# Patient Record
Sex: Female | Born: 1985 | Race: Black or African American | Hispanic: No | Marital: Married | Smoking: Former smoker
Health system: Southern US, Community
[De-identification: ages and names within clinical notes are randomized; demographics above are authoritative.]

## PROBLEM LIST (undated history)

## (undated) ENCOUNTER — Emergency Department (HOSPITAL_COMMUNITY): Admission: EM | Payer: Medicaid Other | Source: Home / Self Care

## (undated) ENCOUNTER — Inpatient Hospital Stay (HOSPITAL_COMMUNITY): Payer: Self-pay

## (undated) DIAGNOSIS — M329 Systemic lupus erythematosus, unspecified: Secondary | ICD-10-CM

## (undated) DIAGNOSIS — Z789 Other specified health status: Secondary | ICD-10-CM

## (undated) DIAGNOSIS — D649 Anemia, unspecified: Secondary | ICD-10-CM

## (undated) DIAGNOSIS — J45909 Unspecified asthma, uncomplicated: Secondary | ICD-10-CM

## (undated) HISTORY — PX: CHOLECYSTECTOMY: SHX55

## (undated) HISTORY — DX: Other specified health status: Z78.9

## (undated) HISTORY — PX: TOOTH EXTRACTION: SUR596

---

## 2016-02-14 NOTE — L&D Delivery Note (Addendum)
Patient is 31 y.o. [id number] [redacted]w[redacted]d admitted for SROM at 0019 this am. Prenatal course also complicated by SLE (not on meds), LGSIL (needs pp colpo), and h/o preterm labor (received 17-p).  Delivery Note At 11:28 AM a viable female was delivered via  (Presentation: LOA).  APGAR: 8,9 ; weight pending .   Placenta status: intact,spontaneous .  Cord: 3 vessel   Anesthesia:  epidural Episiotomy:  none Lacerations:  Bilateral labial, hemostatic Suture Repair: na Est. Blood Loss (mL):  300  Mom to postpartum.  Baby to Couplet care / Skin to Skin.   Upon arrival, patient was complete. She pushed with good maternal effort to deliver a viable female infant in cephalic, LOA position over intact perineum. No nuchal cord present. Anterior shoulder delivered easily, followed by posterior shoulder. Baby was noted to have good tone and place on maternal abdomen for oral suctioning, drying and stimulation. Delayed cord clamping performed. Placenta delivered spontaneously with gentle cord traction. Fundus firm with massage and Pitocin. Perineum inspected and found to have bilateral labial laceration, which was found to be hemostatic, which did not require repair. Counts of sharps, instruments, and lap pads were all correct.   Rolm Bookbinder, DO [place] [facility]

## 2016-04-17 ENCOUNTER — Encounter (HOSPITAL_COMMUNITY): Payer: Self-pay | Admitting: *Deleted

## 2016-04-17 ENCOUNTER — Inpatient Hospital Stay (HOSPITAL_COMMUNITY)
Admission: AD | Admit: 2016-04-17 | Discharge: 2016-04-17 | Disposition: A | Discharge status: Home / Self Care | Payer: Medicaid Other | Source: Ambulatory Visit | Attending: Family Medicine | Admitting: Family Medicine

## 2016-04-17 ENCOUNTER — Telehealth: Payer: Self-pay

## 2016-04-17 DIAGNOSIS — O210 Mild hyperemesis gravidarum: Secondary | ICD-10-CM | POA: Insufficient documentation

## 2016-04-17 DIAGNOSIS — O99611 Diseases of the digestive system complicating pregnancy, first trimester: Secondary | ICD-10-CM | POA: Insufficient documentation

## 2016-04-17 DIAGNOSIS — O219 Vomiting of pregnancy, unspecified: Secondary | ICD-10-CM | POA: Diagnosis not present

## 2016-04-17 DIAGNOSIS — Z3A08 8 weeks gestation of pregnancy: Secondary | ICD-10-CM | POA: Insufficient documentation

## 2016-04-17 DIAGNOSIS — Z87891 Personal history of nicotine dependence: Secondary | ICD-10-CM | POA: Diagnosis not present

## 2016-04-17 DIAGNOSIS — K117 Disturbances of salivary secretion: Secondary | ICD-10-CM

## 2016-04-17 DIAGNOSIS — R748 Abnormal levels of other serum enzymes: Secondary | ICD-10-CM

## 2016-04-17 HISTORY — DX: Anemia, unspecified: D64.9

## 2016-04-17 HISTORY — DX: Unspecified asthma, uncomplicated: J45.909

## 2016-04-17 LAB — POCT PREGNANCY, URINE: Preg Test, Ur: POSITIVE — AB

## 2016-04-17 LAB — COMPREHENSIVE METABOLIC PANEL
ALT: 135 U/L — ABNORMAL HIGH (ref 14–54)
AST: 59 U/L — ABNORMAL HIGH (ref 15–41)
Albumin: 3.9 g/dL (ref 3.5–5.0)
Alkaline Phosphatase: 37 U/L — ABNORMAL LOW (ref 38–126)
Anion gap: 5 (ref 5–15)
BILIRUBIN TOTAL: 0.7 mg/dL (ref 0.3–1.2)
BUN: 9 mg/dL (ref 6–20)
CO2: 23 mmol/L (ref 22–32)
CREATININE: 0.56 mg/dL (ref 0.44–1.00)
Calcium: 9.3 mg/dL (ref 8.9–10.3)
Chloride: 105 mmol/L (ref 101–111)
GFR calc Af Amer: >60 mL/min (ref >60)
Glucose, Bld: 70 mg/dL (ref 65–99)
POTASSIUM: 4 mmol/L (ref 3.5–5.1)
Sodium: 133 mmol/L — ABNORMAL LOW (ref 135–145)
TOTAL PROTEIN: 7.4 g/dL (ref 6.5–8.1)

## 2016-04-17 LAB — LIPASE, BLOOD: LIPASE: 13 U/L (ref 11–51)

## 2016-04-17 LAB — URINALYSIS, ROUTINE W REFLEX MICROSCOPIC
BILIRUBIN URINE: NEGATIVE
GLUCOSE, UA: NEGATIVE mg/dL
HGB URINE DIPSTICK: NEGATIVE
Ketones, ur: 20 mg/dL — AB
LEUKOCYTES UA: NEGATIVE
NITRITE: NEGATIVE
PH: 5 (ref 5.0–8.0)
Protein, ur: 30 mg/dL — AB
Specific Gravity, Urine: 1.031 — ABNORMAL HIGH (ref 1.005–1.030)

## 2016-04-17 LAB — CBC
HEMATOCRIT: 36.3 % (ref 36.0–46.0)
Hemoglobin: 12.8 g/dL (ref 12.0–15.0)
MCH: 33 pg (ref 26.0–34.0)
MCHC: 35.3 g/dL (ref 30.0–36.0)
MCV: 93.6 fL (ref 78.0–100.0)
Platelets: 291 10*3/uL (ref 150–400)
RBC: 3.88 MIL/uL (ref 3.87–5.11)
RDW: 13 % (ref 11.5–15.5)
WBC: 8.4 10*3/uL (ref 4.0–10.5)

## 2016-04-17 MED ORDER — GLYCOPYRROLATE 1 MG PO TABS: 1.0000 mg | ORAL_TABLET | Freq: Three times a day (TID) | ORAL | 0 refills | Status: DC

## 2016-04-17 MED ORDER — PROMETHAZINE HCL 25 MG PO TABS: 25.0000 mg | ORAL_TABLET | Freq: Four times a day (QID) | ORAL | 0 refills | Status: DC | PRN

## 2016-04-17 MED ORDER — GLYCOPYRROLATE 0.2 MG/ML IJ SOLN
0.2000 mg | Freq: Once | INTRAMUSCULAR | Status: AC
  Administered 2016-04-17: 0.2 mg via INTRAVENOUS
  Filled 2016-04-17: qty 1

## 2016-04-17 MED ORDER — PROMETHAZINE HCL 25 MG/ML IJ SOLN
25.0000 mg | Freq: Once | INTRAMUSCULAR | Status: AC
  Administered 2016-04-17: 25 mg via INTRAVENOUS
  Filled 2016-04-17: qty 1

## 2016-04-17 MED ORDER — LACTATED RINGERS IV BOLUS (SEPSIS)
1000.0000 mL | Freq: Once | INTRAVENOUS | Status: AC
  Administered 2016-04-17: 1000 mL via INTRAVENOUS

## 2016-04-17 MED ORDER — FAMOTIDINE IN NACL 20-0.9 MG/50ML-% IV SOLN
20.0000 mg | Freq: Once | INTRAVENOUS | Status: AC
  Administered 2016-04-17: 20 mg via INTRAVENOUS
  Filled 2016-04-17: qty 50

## 2016-04-17 NOTE — MAU Note (Signed)
Past few days, hasn't been able to keep anything down.  Food or water.  Vomiting 4 or 5 times a day. Called the clinic, was told to come here.  preg confirmed at the HD

## 2016-04-17 NOTE — MAU Provider Note (Signed)
History     CSN: [id number]  Arrival date and time: 04/17/16 1551   First Provider Initiated Contact with Patient 04/17/16 1637      Chief Complaint  Patient presents with  . Emesis   HPI Lindsey [name] is a 31 y.o. [id number] at [redacted]w[redacted]d who presents with nausea & vomiting. Reports n/v throughout pregnancy. Vomits 3-5 times per day. Hasn't been able to keep food or fluids down today. Endorses increased saliva/spitting & heartburn. Denies abdominal pain, fever, vaginal bleeding, diarrhea or constipation. Last BM was on Saturday.   OB History    Gravida Para Term Preterm AB Living   4       1 2    SAB TAB Ectopic Multiple Live Births   1              Past Medical History:  Diagnosis Date  . Anemia   . Asthma     Past Surgical History:  Procedure Laterality Date  . CHOLECYSTECTOMY    . TOOTH EXTRACTION      History reviewed. No pertinent family history.  Social History  Substance Use Topics  . Smoking status: Former Smoker    Quit date: 04/17/2012  . Smokeless tobacco: Never Used  . Alcohol use No    Allergies:  Allergies  Allergen Reactions  . Bee Venom Anaphylaxis  . Pineapple Shortness Of Breath and Itching  . Codeine Nausea And Vomiting    Prescriptions Prior to Admission  Medication Sig Dispense Refill Last Dose  . acetaminophen (TYLENOL) 325 MG tablet Take 650 mg by mouth every 6 (six) hours as needed for moderate pain.   04/16/2016 at Unknown time  . Ca Carbonate-Mag Hydroxide (ROLAIDS PO) Take 2 tablets by mouth 3 (three) times daily as needed (heartburn).   Past Week at Unknown time  . Prenatal Vit-Fe Fumarate-FA (PRENATAL MULTIVITAMIN) TABS tablet Take 1 tablet by mouth daily at 12 noon.   04/16/2016 at Unknown time    Review of Systems  Constitutional: Negative for chills and fever.  Gastrointestinal: Positive for nausea and vomiting. Negative for abdominal pain, constipation and diarrhea.  Genitourinary: Negative.    Physical Exam   Blood pressure  112/58, pulse 76, temperature 98.4 F (36.9 C), temperature source Oral, resp. rate 16, height 5\' 3"  (1.6 m), weight 186 lb 3.2 oz (84.5 kg), last menstrual period 02/17/2016, SpO2 100 %.  Physical Exam  Nursing note and vitals reviewed. Constitutional: She is oriented to person, place, and time. She appears well-developed and well-nourished. No distress.  HENT:  Head: Normocephalic and atraumatic.  Mouth/Throat: Mucous membranes are dry.  Eyes: Conjunctivae are normal. Right eye exhibits no discharge. Left eye exhibits no discharge. No scleral icterus.  Neck: Normal range of motion.  Cardiovascular: Normal rate, regular rhythm and normal heart sounds.   No murmur heard. Respiratory: Effort normal and breath sounds normal. No respiratory distress. She has no wheezes.  GI: Soft. Bowel sounds are normal. She exhibits no distension. There is no tenderness. There is negative Murphy's sign.  Neurological: She is alert and oriented to person, place, and time.  Skin: Skin is warm and dry. She is not diaphoretic.  Psychiatric: She has a normal mood and affect. Her behavior is normal. Judgment and thought content normal.    MAU Course  Procedures Results for orders placed or performed during the hospital encounter of 04/17/16 (from the past 24 hour(s))  Urinalysis, Routine w reflex microscopic (not at [facility])     Status: Abnormal  Collection Time: 04/17/16  4:05 PM  Result Value Ref Range   Color, Urine AMBER (A) YELLOW   APPearance HAZY (A) CLEAR   Specific Gravity, Urine 1.031 (H) 1.005 - 1.030   pH 5.0 5.0 - 8.0   Glucose, UA NEGATIVE NEGATIVE mg/dL   Hgb urine dipstick NEGATIVE NEGATIVE   Bilirubin Urine NEGATIVE NEGATIVE   Ketones, ur 20 (A) NEGATIVE mg/dL   Protein, ur 30 (A) NEGATIVE mg/dL   Nitrite NEGATIVE NEGATIVE   Leukocytes, UA NEGATIVE NEGATIVE   RBC / HPF 0-5 0 - 5 RBC/hpf   WBC, UA 0-5 0 - 5 WBC/hpf   Bacteria, UA RARE (A) NONE SEEN   Squamous Epithelial / LPF 0-5 (A)  NONE SEEN   Mucous PRESENT   Pregnancy, urine POC     Status: Abnormal   Collection Time: 04/17/16  4:19 PM  Result Value Ref Range   Preg Test, Ur POSITIVE (A) NEGATIVE  CBC     Status: None   Collection Time: 04/17/16  5:03 PM  Result Value Ref Range   WBC 8.4 4.0 - 10.5 K/uL   RBC 3.88 3.87 - 5.11 MIL/uL   Hemoglobin 12.8 12.0 - 15.0 g/dL   HCT 09.836.3 11.936.0 - 14.746.0 %   MCV 93.6 78.0 - 100.0 fL   MCH 33.0 26.0 - 34.0 pg   MCHC 35.3 30.0 - 36.0 g/dL   RDW 82.913.0 56.211.5 - 13.015.5 %   Platelets 291 150 - 400 K/uL  Comprehensive metabolic panel     Status: Abnormal   Collection Time: 04/17/16  5:03 PM  Result Value Ref Range   Sodium 133 (L) 135 - 145 mmol/L   Potassium 4.0 3.5 - 5.1 mmol/L   Chloride 105 101 - 111 mmol/L   CO2 23 22 - 32 mmol/L   Glucose, Bld 70 65 - 99 mg/dL   BUN 9 6 - 20 mg/dL   Creatinine, Ser 8.650.56 0.44 - 1.00 mg/dL   Calcium 9.3 8.9 - 78.410.3 mg/dL   Total Protein 7.4 6.5 - 8.1 g/dL   Albumin 3.9 3.5 - 5.0 g/dL   AST 59 (H) 15 - 41 U/L   ALT 135 (H) 14 - 54 U/L   Alkaline Phosphatase 37 (L) 38 - 126 U/L   Total Bilirubin 0.7 0.3 - 1.2 mg/dL   GFR calc non Af Amer >60 >60 mL/min   GFR calc Af Amer >60 >60 mL/min   Anion gap 5 5 - 15  Lipase, blood     Status: None   Collection Time: 04/17/16  5:03 PM  Result Value Ref Range   Lipase 13 11 - 51 U/L    MDM Phenergan 25 mg in bag of D5LR, pepcid 20 mg IV, & robinul 0.2mg  CBC, CMP AST & ALT elevated -- lipase ordered -- lipase normal No abdominal pain & benign abdominal exam -- will order hepatitis panel & repeat LFTs at prenatal visit later this month  Assessment and Plan  A: 1. Nausea and vomiting during pregnancy prior to [redacted] weeks gestation   2. Ptyalism   3. Elevated liver enzymes    P: Discharge home Rx phenergan & robinul Advance diet as tolerated Discussed reasons to return to MAU Keep f/u with ob Hepatitis panel pending  Judeth Hornrin Jemar Paulsen 04/17/2016, 4:37 PM

## 2016-04-17 NOTE — Telephone Encounter (Signed)
Returned call and patient stated that she is experiencing severe N&V and cannot even keep down water. [facility] Patient has New OB appt scheduled for 05-08-16.

## 2016-04-17 NOTE — Discharge Instructions (Signed)
Morning Sickness °Morning sickness is when you feel sick to your stomach (nauseous) during pregnancy. This nauseous feeling may or may not come with vomiting. It often occurs in the morning but can be a problem any time of day. Morning sickness is most common during the first trimester, but it may continue throughout pregnancy. While morning sickness is unpleasant, it is usually harmless unless you develop severe and continual vomiting (hyperemesis gravidarum). This condition requires more intense treatment. °What are the causes? °The cause of morning sickness is not completely known but seems to be related to normal hormonal changes that occur in pregnancy. °What increases the risk? °You are at greater risk if you: °· Experienced nausea or vomiting before your pregnancy. °· Had morning sickness during a previous pregnancy. °· Are pregnant with more than one baby, such as twins. ° °How is this treated? °Do not use any medicines (prescription, over-the-counter, or herbal) for morning sickness without first talking to your health care provider. Your health care provider may prescribe or recommend: °· Vitamin B6 supplements. °· Anti-nausea medicines. °· The herbal medicine ginger. ° °Follow these instructions at home: °· Only take over-the-counter or prescription medicines as directed by your health care provider. °· Taking multivitamins before getting pregnant can prevent or decrease the severity of morning sickness in most women. °· Eat a piece of dry toast or unsalted crackers before getting out of bed in the morning. °· Eat five or six small meals a day. °· Eat dry and bland foods (rice, baked potato). Foods high in carbohydrates are often helpful. °· Do not drink liquids with your meals. Drink liquids between meals. °· Avoid greasy, fatty, and spicy foods. °· Get someone to cook for you if the smell of any food causes nausea and vomiting. °· If you feel nauseous after taking prenatal vitamins, take the vitamins at  night or with a snack. °· Snack on protein foods (nuts, yogurt, cheese) between meals if you are hungry. °· Eat unsweetened gelatins for desserts. °· Wearing an acupressure wristband (worn for sea sickness) may be helpful. °· Acupuncture may be helpful. °· Do not smoke. °· Get a humidifier to keep the air in your house free of odors. °· Get plenty of fresh air. °Contact a health care provider if: °· Your home remedies are not working, and you need medicine. °· You feel dizzy or lightheaded. °· You are losing weight. °Get help right away if: °· You have persistent and uncontrolled nausea and vomiting. °· You pass out (faint). °This information is not intended to replace advice given to you by your health care provider. Make sure you discuss any questions you have with your health care provider. °Document Released: 03/23/2006 Document Revised: 07/08/2015 Document Reviewed: 07/17/2012 °Elsevier Interactive Patient Education © 2017 [employer] ° °

## 2016-04-18 LAB — HEPATITIS PANEL, ACUTE
HCV Ab: <0.1 s/co ratio (ref 0.0–0.9)
HEP B S AG: NEGATIVE
Hep A IgM: NEGATIVE
Hep B C IgM: NEGATIVE

## 2016-05-08 ENCOUNTER — Other Ambulatory Visit (HOSPITAL_COMMUNITY)
Admission: RE | Admit: 2016-05-08 | Discharge: 2016-05-08 | Disposition: A | Discharge status: Home / Self Care | Payer: Medicaid Other | Source: Ambulatory Visit | Attending: Certified Nurse Midwife | Admitting: Certified Nurse Midwife

## 2016-05-08 ENCOUNTER — Ambulatory Visit (INDEPENDENT_AMBULATORY_CARE_PROVIDER_SITE_OTHER): Payer: Medicaid Other | Admitting: Certified Nurse Midwife

## 2016-05-08 ENCOUNTER — Encounter: Payer: Self-pay | Admitting: Certified Nurse Midwife

## 2016-05-08 VITALS — BP 108/72 | HR 82 | Temp 98.8°F | Wt 183.8 lb

## 2016-05-08 DIAGNOSIS — D6862 Lupus anticoagulant syndrome: Secondary | ICD-10-CM

## 2016-05-08 DIAGNOSIS — O219 Vomiting of pregnancy, unspecified: Secondary | ICD-10-CM

## 2016-05-08 DIAGNOSIS — O99111 Other diseases of the blood and blood-forming organs and certain disorders involving the immune mechanism complicating pregnancy, first trimester: Secondary | ICD-10-CM

## 2016-05-08 DIAGNOSIS — M329 Systemic lupus erythematosus, unspecified: Secondary | ICD-10-CM

## 2016-05-08 DIAGNOSIS — Z3481 Encounter for supervision of other normal pregnancy, first trimester: Secondary | ICD-10-CM

## 2016-05-08 DIAGNOSIS — O0990 Supervision of high risk pregnancy, unspecified, unspecified trimester: Secondary | ICD-10-CM | POA: Insufficient documentation

## 2016-05-08 DIAGNOSIS — N870 Mild cervical dysplasia: Secondary | ICD-10-CM | POA: Diagnosis not present

## 2016-05-08 DIAGNOSIS — O09219 Supervision of pregnancy with history of pre-term labor, unspecified trimester: Secondary | ICD-10-CM | POA: Insufficient documentation

## 2016-05-08 DIAGNOSIS — Z789 Other specified health status: Secondary | ICD-10-CM | POA: Insufficient documentation

## 2016-05-08 DIAGNOSIS — IMO0002 Reserved for concepts with insufficient information to code with codable children: Secondary | ICD-10-CM | POA: Insufficient documentation

## 2016-05-08 LAB — OB RESULTS CONSOLE GC/CHLAMYDIA: Gonorrhea: NEGATIVE

## 2016-05-08 MED ORDER — ASPIRIN 81 MG PO CHEW: 81.0000 mg | CHEWABLE_TABLET | Freq: Every day | ORAL | 12 refills | Status: DC

## 2016-05-08 MED ORDER — PRENATE PIXIE 10-0.6-0.4-200 MG PO CAPS: 1.0000 | ORAL_CAPSULE | Freq: Every day | ORAL | 12 refills | Status: DC

## 2016-05-08 MED ORDER — DOXYLAMINE-PYRIDOXINE 10-10 MG PO TBEC: DELAYED_RELEASE_TABLET | ORAL | 4 refills | Status: DC

## 2016-05-08 NOTE — Progress Notes (Signed)
Subjective:    Lindsey [name] is being seen today for her first obstetrical visit.  This is a planned pregnancy. She is at [redacted]w[redacted]d gestation. Her obstetrical history is significant for Lupus and preterm delivery @36  weeks. Relationship with FOB: spouse, living together. Patient does intend to breast feed. Pregnancy history fully reviewed.  The information documented in the HPI was reviewed and verified.  Menstrual History: OB History    Gravida Para Term Preterm AB Living   4 2 1 1 1 2    SAB TAB Ectopic Multiple Live Births   0 1     2       Patient's last menstrual period was 02/21/2016.    Past Medical History:  Diagnosis Date  . Anemia   . Asthma   . Medical history non-contributory     Past Surgical History:  Procedure Laterality Date  . CHOLECYSTECTOMY    . TOOTH EXTRACTION       (Not in a hospital admission) Allergies  Allergen Reactions  . Bee Venom Anaphylaxis  . Pineapple Shortness Of Breath and Itching  . Codeine Nausea And Vomiting    Social History  Substance Use Topics  . Smoking status: Former Smoker    Quit date: 04/18/2011  . Smokeless tobacco: Never Used  . Alcohol use No    Family History  Problem Relation Age of Onset  . Diabetes Mother   . Diabetes Paternal Grandmother      Review of Systems Constitutional: negative for weight loss Gastrointestinal: + for nausea & vomiting Genitourinary:negative for genital lesions and vaginal discharge and dysuria Musculoskeletal:negative for back pain Behavioral/Psych: negative for abusive relationship, depression, illegal drug usage and tobacco use    Objective:    BP 108/72   Pulse 82   Temp 98.8 F (37.1 C)   Wt 183 lb 12.8 oz (83.4 kg)   LMP 02/21/2016   BMI 32.56 kg/m  General Appearance:    Alert, cooperative, no distress, appears stated age  Head:    Normocephalic, without obvious abnormality, atraumatic  Eyes:    PERRL, conjunctiva/corneas clear, EOM's intact, fundi    benign, both  eyes  Ears:    Normal TM's and external ear canals, both ears  Nose:   Nares normal, septum midline, mucosa normal, no drainage    or sinus tenderness  Throat:   Lips, mucosa, and tongue normal; teeth and gums normal  Neck:   Supple, symmetrical, trachea midline, no adenopathy;    thyroid:  no enlargement/tenderness/nodules; no carotid   bruit or JVD  Back:     Symmetric, no curvature, ROM normal, no CVA tenderness  Lungs:     Clear to auscultation bilaterally, respirations unlabored  Chest Wall:    No tenderness or deformity   Heart:    Regular rate and rhythm, S1 and S2 normal, no murmur, rub   or gallop  Breast Exam:    No tenderness, masses, or nipple abnormality  Abdomen:     Soft, non-tender, bowel sounds active all four quadrants,    no masses, no organomegaly  Genitalia:    Normal female without lesion, discharge or tenderness  Extremities:   Extremities normal, atraumatic, no cyanosis or edema  Pulses:   2+ and symmetric all extremities  Skin:   Skin color, texture, turgor normal, no rashes or lesions  Lymph nodes:   Cervical, supraclavicular, and axillary nodes normal  Neurologic:   CNII-XII intact, normal strength, sensation and reflexes    throughout  Cervix: long, thick, midline. FHR: 158 by doppler.  FH: size less than U.   Lab Review Urine pregnancy test Labs reviewed no Radiologic studies reviewed no Assessment:    Pregnancy at 3630w0d weeks   Supervision of high risk pregnancy, antepartum - Plan: Cytology - PAP, Cervicovaginal ancillary only, Hemoglobinopathy evaluation, Hemoglobin A1c, Vitamin D (25 hydroxy), Culture, OB Urine, Obstetric Panel, Including HIV, Cystic Fibrosis Mutation 97, Varicella zoster antibody, IgG, TSH, MaterniT Genome, aspirin 81 MG chewable tablet, Prenat-FeAsp-Meth-FA-DHA w/o A (PRENATE PIXIE) 10-0.6-0.4-200 MG CAPS, AMB referral to maternal fetal medicine, AMB MFM GENETICS REFERRAL, US MFM OB DETAIL +14 WK  History of lupus - Plan:  Ambulatory referral to Rheumatology, Protein / creatinine ratio, urine, Hepatic function panel, Lupus anticoagulant panel, Cardiolipin antibodies, IgG, IgM, IgA, Anti-DNA antibody, double-stranded, aspirin 81 MG chewable tablet, AMB referral to maternal fetal medicine, AMB MFM GENETICS REFERRAL, US MFM OB DETAIL +14 WK  Nausea and vomiting during pregnancy prior to [redacted] weeks gestation - Plan: Doxylamine-Pyridoxine (DICLEGIS) 10-10 MG TBEC    Hx of Asthma Plan:      Prenatal vitamins.  Counseling provided regarding continued use of seat belts, cessation of alcohol consumption, smoking or use of illicit drugs; infection precautions i.e., influenza/TDAP immunizations, toxoplasmosis,CMV, parvovirus, listeria and varicella; workplace safety, exercise during pregnancy; routine dental care, safe medications, sexual activity, hot tubs, saunas, pools, travel, caffeine use, fish and methlymercury, potential toxins, hair treatments, varicose veins Weight gain recommendations per IOM guidelines reviewed: underweight/BMI< 18.5--> gain 28 - 40 lbs; normal weight/BMI 18.5 - 24.9--> gain 25 - 35 lbs; overweight/BMI 25 - 29.9--> gain 15 - 25 lbs; obese/BMI >30->gain  11 - 20 lbs Problem list reviewed and updated. FIRST/CF mutation testing/NIPT/QUAD SCREEN/fragile X/Ashkenazi Jewish population testing/Spinal muscular atrophy discussed: ordered. Role of ultrasound in pregnancy discussed; fetal survey: ordered. Amniocentesis discussed: not discussed.  Meds ordered this encounter  Medications  . aspirin 81 MG chewable tablet    Sig: Chew 1 tablet (81 mg total) by mouth daily.    Dispense:  30 tablet    Refill:  12  . Prenat-FeAsp-Meth-FA-DHA w/o A (PRENATE PIXIE) 10-0.6-0.4-200 MG CAPS    Sig: Take 1 tablet by mouth daily.    Dispense:  30 capsule    Refill:  12    Please process coupon: Rx BIN: [id number], RxPCN: [id number], RxGRP: [id number]: [id number], Rx: [id number]: [id number]  SUF: 01  . Doxylamine-Pyridoxine (DICLEGIS) 10-10 MG  TBEC    Sig: Take 1 tablet with breakfast and lunch.  Take 2 tablets at bedtime.    Dispense:  100 tablet    Refill:  4   Orders Placed This Encounter  Procedures  . Culture, OB Urine  . US MFM OB DETAIL +14 WK    Standing Status:   Future    Standing Expiration Date:   07/08/2017    Order Specific Question:   Reason for Exam (SYMPTOM  OR DIAGNOSIS REQUIRED)    Answer:   fetal anatomy scan    Order Specific Question:   Preferred imaging location?    Answer:   [facility]-Ul[facility]ound  . Hemoglobinopathy evaluation  . Hemoglobin A1c  . Vitamin D (25 hydroxy)  . Obstetric Panel, Including HIV  . Cystic Fibrosis Mutation 97  . Varicella zoster antibody, IgG  . TSH  . MaterniT Genome    Order Specific Question:   Is the patient insulin dependent?    Answer:   No    Order Specific Question:   Please enter gestational age. This should  be expressed as weeks AND days, i.e. 16w 6d. Enter weeks here. Enter days in next question.    Answer:   10    Order Specific Question:   Please enter gestational age. This should be expressed as weeks AND days, i.e. 16w 6d. Enter days here. Enter weeks in previous question.    Answer:   0    Order Specific Question:   How was gestational age calculated?    Answer:   LMP    Order Specific Question:   Please give the date of LMP OR Ultrasound OR Estimated date of delivery.    Answer:   11/27/2016    Order Specific Question:   Number of Fetuses (Type of Pregnancy):    Answer:   1    Order Specific Question:   Indications for performing the test? (please choose all that apply):    Answer:   Routine screening    Order Specific Question:   Other Indications? (Y=Yes, N=No)    Answer:   Y    Order Specific Question:   Please specify other indications, if any:    Answer:   Lupus    Order Specific Question:   If this is a repeat specimen, please indicate the reason:    Answer:   Not indicated    Order Specific Question:   Please specify the patient's race:  (C=White/Caucasion, B=Black, I=Native American, A=Asian, H=Hispanic, O=Other, U=Unknown)    Answer:   B    Order Specific Question:   Donor Egg - indicate if the egg was obtained from in vitro fertilization.    Answer:   N    Order Specific Question:   Age of Egg Donor.    Answer:   21    Order Specific Question:   Prior Down Syndrome/ONTD screening during current pregnancy.    Answer:   N    Order Specific Question:   Prior First Trimester Testing    Answer:   N    Order Specific Question:   Prior Second Trimester Testing    Answer:   N    Order Specific Question:   Family History of Neural Tube Defects    Answer:   N    Order Specific Question:   Prior Pregnancy with Down Syndrome    Answer:   N    Order Specific Question:   Please give the patient's weight (in pounds)    Answer:   186  . Protein / creatinine ratio, urine  . Hepatic function panel  . Lupus anticoagulant panel  . Cardiolipin antibodies, IgG, IgM, IgA  . Anti-DNA antibody, double-stranded  . Ambulatory referral to Rheumatology    Referral Priority:   Routine    Referral Type:   Consultation    Referral Reason:   Specialty Services Required    Requested Specialty:   Rheumatology    Number of Visits Requested:   1  . AMB referral to maternal fetal medicine    Referral Priority:   Routine    Referral Type:   Consultation    Referral Reason:   Specialty Services Required    Number of Visits Requested:   1  . AMB MFM GENETICS REFERRAL    Referral Priority:   Routine    Referral Type:   Consultation    Referral Reason:   Specialty Services Required    Number of Visits Requested:   1    Follow up in 4 weeks. 50% of 30 min visit  spent on counseling and coordination of care.

## 2016-05-08 NOTE — Progress Notes (Signed)
Patient is in the office for initial ob visit, complains of N&V.

## 2016-05-09 LAB — CERVICOVAGINAL ANCILLARY ONLY
BACTERIAL VAGINITIS: NEGATIVE
CHLAMYDIA, DNA PROBE: NEGATIVE
Candida vaginitis: NEGATIVE
NEISSERIA GONORRHEA: NEGATIVE
TRICH (WINDOWPATH): NEGATIVE

## 2016-05-09 LAB — PROTEIN / CREATININE RATIO, URINE
Creatinine, Urine: 248.9 mg/dL
Protein, Ur: 32.3 mg/dL
Protein/Creat Ratio: 130 mg/g creat (ref 0–200)

## 2016-05-10 LAB — CYTOLOGY - PAP: ADEQUACY: ABSENT — AB

## 2016-05-11 ENCOUNTER — Other Ambulatory Visit: Payer: Self-pay | Admitting: Certified Nurse Midwife

## 2016-05-11 DIAGNOSIS — R87612 Low grade squamous intraepithelial lesion on cytologic smear of cervix (LGSIL): Secondary | ICD-10-CM

## 2016-05-12 LAB — OBSTETRIC PANEL, INCLUDING HIV
Antibody Screen: NEGATIVE
Basophils Absolute: 0 10*3/uL (ref 0.0–0.2)
Basos: 0 %
EOS (ABSOLUTE): 0.1 10*3/uL (ref 0.0–0.4)
Eos: 2 %
HIV Screen 4th Generation wRfx: NONREACTIVE
Hematocrit: 32.7 % — ABNORMAL LOW (ref 34.0–46.6)
Hemoglobin: 11.2 g/dL (ref 11.1–15.9)
Hepatitis B Surface Ag: NEGATIVE
IMMATURE GRANS (ABS): 0 10*3/uL (ref 0.0–0.1)
Immature Granulocytes: 0 %
Lymphocytes Absolute: 2.2 10*3/uL (ref 0.7–3.1)
Lymphs: 26 %
MCH: 32.3 pg (ref 26.6–33.0)
MCHC: 34.3 g/dL (ref 31.5–35.7)
MCV: 94 fL (ref 79–97)
MONOCYTES: 8 %
Monocytes Absolute: 0.7 10*3/uL (ref 0.1–0.9)
Neutrophils Absolute: 5.4 10*3/uL (ref 1.4–7.0)
Neutrophils: 64 %
PLATELETS: 315 10*3/uL (ref 150–379)
RBC: 3.47 x10E6/uL — ABNORMAL LOW (ref 3.77–5.28)
RDW: 13.9 % (ref 12.3–15.4)
RPR: NONREACTIVE
RUBELLA: 1.24 {index} (ref >0.99)
Rh Factor: POSITIVE
WBC: 8.4 10*3/uL (ref 3.4–10.8)

## 2016-05-12 LAB — CARDIOLIPIN ANTIBODIES, IGG, IGM, IGA
ANTICARDIOLIPIN IGM: 9 [MPL'U]/mL (ref 0–12)
Anticardiolipin IgA: <9 APL U/mL (ref 0–11)

## 2016-05-12 LAB — CYSTIC FIBROSIS MUTATION 97: GENE DIS ANAL CARRIER INTERP BLD/T-IMP: NOT DETECTED

## 2016-05-12 LAB — HEMOGLOBINOPATHY EVALUATION
HGB C: 0 %
HGB S: 0 %
HGB VARIANT: 0 %
Hemoglobin A2 Quantitation: 2.5 % (ref 1.8–3.2)
Hemoglobin F Quantitation: 0 % (ref 0.0–2.0)
Hgb A: 97.5 % (ref 96.4–98.8)

## 2016-05-12 LAB — HEPATIC FUNCTION PANEL
ALK PHOS: 52 IU/L (ref 39–117)
ALT: 28 IU/L (ref 0–32)
AST: 15 IU/L (ref 0–40)
Albumin: 4 g/dL (ref 3.5–5.5)
Bilirubin Total: 0.3 mg/dL (ref 0.0–1.2)
Bilirubin, Direct: 0.09 mg/dL (ref 0.00–0.40)
TOTAL PROTEIN: 7.3 g/dL (ref 6.0–8.5)

## 2016-05-12 LAB — LUPUS ANTICOAGULANT PANEL
DRVVT: 30 s (ref 0.0–47.0)
PTT LA: 32.4 s (ref 0.0–51.9)

## 2016-05-12 LAB — TSH: TSH: 2.58 u[IU]/mL (ref 0.450–4.500)

## 2016-05-12 LAB — VARICELLA ZOSTER ANTIBODY, IGG: VARICELLA: 2307 {index} (ref >165)

## 2016-05-12 LAB — ANTI-DNA ANTIBODY, DOUBLE-STRANDED: dsDNA Ab: 2 IU/mL (ref 0–9)

## 2016-05-12 LAB — VITAMIN D 25 HYDROXY (VIT D DEFICIENCY, FRACTURES): VIT D 25 HYDROXY: 9.5 ng/mL — AB (ref 30.0–100.0)

## 2016-05-12 LAB — HEMOGLOBIN A1C
ESTIMATED AVERAGE GLUCOSE: 111 mg/dL
Hgb A1c MFr Bld: 5.5 % (ref 4.8–5.6)

## 2016-05-14 LAB — URINE CULTURE, OB REFLEX

## 2016-05-14 LAB — CULTURE, OB URINE

## 2016-05-15 ENCOUNTER — Other Ambulatory Visit: Payer: Self-pay | Admitting: Certified Nurse Midwife

## 2016-05-15 DIAGNOSIS — O0990 Supervision of high risk pregnancy, unspecified, unspecified trimester: Secondary | ICD-10-CM

## 2016-05-15 DIAGNOSIS — R7989 Other specified abnormal findings of blood chemistry: Secondary | ICD-10-CM

## 2016-05-15 LAB — MATERNIT GENOME

## 2016-05-15 MED ORDER — VITAMIN D (ERGOCALCIFEROL) 1.25 MG (50000 UNIT) PO CAPS: 50000.0000 [IU] | ORAL_CAPSULE | ORAL | 2 refills | Status: DC

## 2016-06-05 ENCOUNTER — Ambulatory Visit (INDEPENDENT_AMBULATORY_CARE_PROVIDER_SITE_OTHER): Payer: Medicaid Other | Admitting: Obstetrics and Gynecology

## 2016-06-05 VITALS — BP 106/71 | HR 83 | Wt 190.0 lb

## 2016-06-05 DIAGNOSIS — O0992 Supervision of high risk pregnancy, unspecified, second trimester: Secondary | ICD-10-CM | POA: Diagnosis not present

## 2016-06-05 DIAGNOSIS — O99112 Other diseases of the blood and blood-forming organs and certain disorders involving the immune mechanism complicating pregnancy, second trimester: Secondary | ICD-10-CM

## 2016-06-05 DIAGNOSIS — R87612 Low grade squamous intraepithelial lesion on cytologic smear of cervix (LGSIL): Secondary | ICD-10-CM

## 2016-06-05 DIAGNOSIS — O0990 Supervision of high risk pregnancy, unspecified, unspecified trimester: Secondary | ICD-10-CM

## 2016-06-05 DIAGNOSIS — D6862 Lupus anticoagulant syndrome: Secondary | ICD-10-CM

## 2016-06-05 DIAGNOSIS — O99119 Other diseases of the blood and blood-forming organs and certain disorders involving the immune mechanism complicating pregnancy, unspecified trimester: Secondary | ICD-10-CM

## 2016-06-05 DIAGNOSIS — O09212 Supervision of pregnancy with history of pre-term labor, second trimester: Secondary | ICD-10-CM | POA: Diagnosis not present

## 2016-06-05 MED ORDER — HYDROXYCHLOROQUINE SULFATE 200 MG PO TABS: 200.0000 mg | ORAL_TABLET | Freq: Every day | ORAL | 6 refills | Status: DC

## 2016-06-05 NOTE — Progress Notes (Signed)
   PRENATAL VISIT NOTE  Subjective:  Lindsey [name] is a 31 y.o. Z6X0960 at [redacted]w[redacted]d being seen today for ongoing prenatal care.  She is currently monitored for the following issues for this high-risk pregnancy and has Supervision of high risk pregnancy, antepartum; Medical history non-contributory; History of lupus; Current pregnancy with history of preterm labor; Low grade squamous intraepithelial lesion (LGSIL) on cervical Pap smear; Low vitamin D level; and Lupus anticoagulant complicating pregnancy, antepartum (HCC) on her problem list.  Patient reports no complaints.  Contractions: Not present. Vag. Bleeding: None.  Movement: Present. Denies leaking of fluid.   The following portions of the patient's history were reviewed and updated as appropriate: allergies, current medications, past family history, past medical history, past social history, past surgical history and problem list. Problem list updated.  Objective:   Vitals:   06/05/16 0903  BP: 106/71  Pulse: 83  Weight: 190 lb (86.2 kg)    Fetal Status: Fetal Heart Rate (bpm): 153   Movement: Present     General:  Alert, oriented and cooperative. Patient is in no acute distress.  Skin: Skin is warm and dry. No rash noted.   Cardiovascular: Normal heart rate noted  Respiratory: Normal respiratory effort, no problems with respiration noted  Abdomen: Soft, gravid, appropriate for gestational age. Pain/Pressure: Present     Pelvic:  Cervical exam deferred        Extremities: Normal range of motion.  Edema: None  Mental Status: Normal mood and affect. Normal behavior. Normal judgment and thought content.   Assessment and Plan:  Pregnancy: A5W0981 at [redacted]w[redacted]d  1. Supervision of high risk pregnancy, antepartum Patient is doing well without complaints Follow up anatomy ultrasound already scheduled - Ambulatory referral to Rheumatology  2. Current pregnancy with history of pre-term labor in second trimester Patient with previous  36 week delivery Patient desires weekly 17-P. Form completed today with plans to start 17-P next week  3. Lupus anticoagulant complicating pregnancy, antepartum (HCC) - Patient has not been taking the Plaquenil as she relocated and has not established care - Rx provided and referral to rheumatology made - Continue daily ASA  4. Low grade squamous intraepithelial lesion (LGSIL) on cervical Pap smear Plan for colpo postpartum Will inform patient at her next visit if not already informed  General obstetric precautions including but not limited to vaginal bleeding, contractions, leaking of fluid and fetal movement were reviewed in detail with the patient. Please refer to After Visit Summary for other counseling recommendations.  Return in about 4 weeks (around 07/03/2016) for ROB, weekly for 17-P.   Catalina Antigua, MD

## 2016-06-12 ENCOUNTER — Ambulatory Visit: Payer: Medicaid Other

## 2016-06-16 ENCOUNTER — Encounter: Payer: Self-pay | Admitting: *Deleted

## 2016-06-16 NOTE — Progress Notes (Signed)
TC from pt needs a high risk pregnancy letter for court.

## 2016-06-19 ENCOUNTER — Ambulatory Visit: Payer: Medicaid Other

## 2016-06-26 ENCOUNTER — Ambulatory Visit: Payer: Medicaid Other

## 2016-06-30 ENCOUNTER — Ambulatory Visit (HOSPITAL_COMMUNITY)
Admission: RE | Admit: 2016-06-30 | Discharge: 2016-06-30 | Disposition: A | Discharge status: Home / Self Care | Payer: Medicaid Other | Source: Ambulatory Visit | Attending: Certified Nurse Midwife | Admitting: Certified Nurse Midwife

## 2016-06-30 ENCOUNTER — Encounter (HOSPITAL_COMMUNITY): Payer: Self-pay

## 2016-06-30 ENCOUNTER — Other Ambulatory Visit (HOSPITAL_COMMUNITY): Payer: Self-pay | Admitting: *Deleted

## 2016-06-30 ENCOUNTER — Other Ambulatory Visit: Payer: Self-pay | Admitting: Certified Nurse Midwife

## 2016-06-30 DIAGNOSIS — M329 Systemic lupus erythematosus, unspecified: Secondary | ICD-10-CM

## 2016-06-30 DIAGNOSIS — O9989 Other specified diseases and conditions complicating pregnancy, childbirth and the puerperium: Secondary | ICD-10-CM | POA: Diagnosis present

## 2016-06-30 DIAGNOSIS — Z3A18 18 weeks gestation of pregnancy: Secondary | ICD-10-CM

## 2016-06-30 DIAGNOSIS — O0990 Supervision of high risk pregnancy, unspecified, unspecified trimester: Secondary | ICD-10-CM

## 2016-06-30 DIAGNOSIS — O99891 Other specified diseases and conditions complicating pregnancy: Secondary | ICD-10-CM

## 2016-06-30 DIAGNOSIS — Z3689 Encounter for other specified antenatal screening: Secondary | ICD-10-CM

## 2016-06-30 DIAGNOSIS — Z8751 Personal history of pre-term labor: Secondary | ICD-10-CM

## 2016-06-30 DIAGNOSIS — IMO0002 Reserved for concepts with insufficient information to code with codable children: Secondary | ICD-10-CM

## 2016-06-30 NOTE — Progress Notes (Signed)
Maternal Fetal Medicine Consultation  Requesting Provider(s): [facility]  Primary OB: Constant Reason for consultation: SLE  HPI: 31yo P11012 at 18+4 weeks with 5 year history of SLE. She was diagnosed after a long period of fatigue, joint swelling and proteinuria. She has not had a flare for about a year. She is on Plaquenil 200mg  daily x 4 weeks, she had stopped the medication when she found she was pregnant. She has undergone APL labs and there is no APL antibodies or lupus anticoagulant. She has not had complement levels or a 24h urine.   OB History: OB History    Gravida Para Term Preterm AB Living   4 2 1 1 1 2    SAB TAB Ectopic Multiple Live Births   0 1     2    Her first child was born in 2007 prior to her lupus diagnosis and weight >10 lb; she did not have GDM. Her second baby was born at 36 weeks and weighed 5 pounds 2 ounces. It was a spontaneous PTD  PMH:  Past Medical History:  Diagnosis Date  . Anemia   . Asthma   . Medical history non-contributory     PSH:  Past Surgical History:  Procedure Laterality Date  . CHOLECYSTECTOMY    . TOOTH EXTRACTION     Meds: See EPIC section Allergies: codeine FH:See EPIC section Soc:See EPIC section  Review of Systems: no vaginal bleeding or cramping/contractions, no LOF, no nausea/vomiting. All other systems reviewed and are negative.  PE: See EPIC section  Please see separate document for fetal ultrasound report.  A/P: 1. SLE: she does not meet diagnostic criteria for APAS and does not require thromboprophylaxis. I strongly recommend obtaining a 24h urine and complement levels as soon as possible both for baseline purposes. We will see here every 4 weeks to assess growth and I would recommend starting antepartum testing at 30 weeks. She stated you were planning referral to a rheumatologist and I would strongly encourage that to be done as soon as possible. I reassured her the Plaquenil was safe for pregnancy, and that if she  required steroids for flare those were relatively safe as well. 2. History of late PTD: she stated she was told that she should start 17-OHP injections but missed that appointment. I encouraged her to contact your office today so that can be started immediately  Thank you for the opportunity to be a part of the care of Lindsey [name]. Please contact our office if we can be of further assistance.   I spent approximately 30 minutes with this patient with over 50% of time spent in face-to-face counseling.

## 2016-07-03 ENCOUNTER — Encounter: Payer: Medicaid Other | Admitting: Obstetrics and Gynecology

## 2016-07-03 ENCOUNTER — Other Ambulatory Visit: Payer: Self-pay | Admitting: Certified Nurse Midwife

## 2016-07-03 DIAGNOSIS — O0990 Supervision of high risk pregnancy, unspecified, unspecified trimester: Secondary | ICD-10-CM

## 2016-07-05 ENCOUNTER — Ambulatory Visit (INDEPENDENT_AMBULATORY_CARE_PROVIDER_SITE_OTHER): Payer: Medicaid Other

## 2016-07-05 DIAGNOSIS — O09212 Supervision of pregnancy with history of pre-term labor, second trimester: Secondary | ICD-10-CM | POA: Diagnosis not present

## 2016-07-05 DIAGNOSIS — Z8751 Personal history of pre-term labor: Secondary | ICD-10-CM

## 2016-07-05 MED ORDER — HYDROXYPROGESTERONE CAPROATE 250 MG/ML IM OIL
250.0000 mg | TOPICAL_OIL | Freq: Once | INTRAMUSCULAR | Status: AC
  Administered 2016-07-05: 250 mg via INTRAMUSCULAR

## 2016-07-05 NOTE — Progress Notes (Signed)
17p given L upper outer quad w/o difficulty

## 2016-07-12 ENCOUNTER — Encounter: Payer: Self-pay | Admitting: Obstetrics & Gynecology

## 2016-07-12 ENCOUNTER — Other Ambulatory Visit: Payer: Self-pay | Admitting: Student

## 2016-07-12 ENCOUNTER — Ambulatory Visit (INDEPENDENT_AMBULATORY_CARE_PROVIDER_SITE_OTHER): Payer: Medicaid Other | Admitting: Obstetrics & Gynecology

## 2016-07-12 VITALS — BP 112/70 | HR 90 | Wt 187.5 lb

## 2016-07-12 DIAGNOSIS — D6862 Lupus anticoagulant syndrome: Secondary | ICD-10-CM

## 2016-07-12 DIAGNOSIS — O0990 Supervision of high risk pregnancy, unspecified, unspecified trimester: Secondary | ICD-10-CM

## 2016-07-12 DIAGNOSIS — O99119 Other diseases of the blood and blood-forming organs and certain disorders involving the immune mechanism complicating pregnancy, unspecified trimester: Principal | ICD-10-CM

## 2016-07-12 DIAGNOSIS — O99112 Other diseases of the blood and blood-forming organs and certain disorders involving the immune mechanism complicating pregnancy, second trimester: Secondary | ICD-10-CM

## 2016-07-12 DIAGNOSIS — O09212 Supervision of pregnancy with history of pre-term labor, second trimester: Secondary | ICD-10-CM

## 2016-07-12 DIAGNOSIS — O0992 Supervision of high risk pregnancy, unspecified, second trimester: Secondary | ICD-10-CM

## 2016-07-12 MED ORDER — HYDROXYPROGESTERONE CAPROATE 250 MG/ML IM OIL
250.0000 mg | TOPICAL_OIL | Freq: Once | INTRAMUSCULAR | Status: AC
  Administered 2016-07-12: 250 mg via INTRAMUSCULAR

## 2016-07-12 NOTE — Progress Notes (Signed)
   PRENATAL VISIT NOTE  Subjective:  Lindsey [name] [name] a 31 y.o. [id number] at 6936w2d being seen today for ongoing prenatal care.  Lindsey [name] currently monitored for the following issues for this high-risk pregnancy and has Supervision of high risk pregnancy, antepartum; Medical history non-contributory; History of lupus; Current pregnancy with history of preterm labor; Low grade squamous intraepithelial lesion (LGSIL) on cervical Pap smear; Low vitamin D level; Lupus anticoagulant complicating pregnancy, antepartum (HCC); Systemic lupus complicating pregnancy (HCC); and History of preterm delivery on her problem list.  Patient reports no complaints.  Contractions: Not present. Vag. Bleeding: None.  Movement: Present. Denies leaking of fluid.   The following portions of the patient's history were reviewed and updated as appropriate: allergies, current medications, past family history, past medical history, past social history, past surgical history and problem list. Problem list updated.  Objective:   Vitals:   07/12/16 1343  BP: 112/70  Pulse: 90  Weight: 187 lb 8 oz (85 kg)    Fetal Status: Fetal Heart Rate (bpm): 148 Fundal Height: 20 cm Movement: Present     General:  Alert, oriented and cooperative. Patient [name] in no acute distress.  Skin: Skin [name] warm and dry. No rash noted.   Cardiovascular: Normal heart rate noted  Respiratory: Normal respiratory effort, no problems with respiration noted  Abdomen: Soft, gravid, appropriate for gestational age. Pain/Pressure: Present     Pelvic:  Cervical exam deferred        Extremities: Normal range of motion.  Edema: None  Mental Status: Normal mood and affect. Normal behavior. Normal judgment and thought content.   Assessment and Plan:  Pregnancy: [id number] at 936w2d  1. Supervision of high risk pregnancy, antepartum 17 p for h/o PTB  Preterm labor symptoms and general obstetric precautions including but not limited to vaginal bleeding,  contractions, leaking of fluid and fetal movement were reviewed in detail with the patient. Please refer to After Visit Summary for other counseling recommendations.  Return in about 4 weeks (around 08/09/2016).   Scheryl DarterJames Wagner Tanzi, MD

## 2016-07-12 NOTE — Patient Instructions (Signed)
Second Trimester of Pregnancy The second trimester is from week 13 through week 28, month 4 through 6. This is often the time in pregnancy that you feel your best. Often times, morning sickness has lessened or quit. You may have more energy, and you may get hungry more often. Your unborn baby (fetus) is growing rapidly. At the end of the sixth month, he or she is about 9 inches long and weighs about 1 pounds. You will likely feel the baby move (quickening) between 18 and 20 weeks of pregnancy. Follow these instructions at home:  Avoid all smoking, herbs, and alcohol. Avoid drugs not approved by your doctor.  Do not use any tobacco products, including cigarettes, chewing tobacco, and electronic cigarettes. If you need help quitting, ask your doctor. You may get counseling or other support to help you quit.  Only take medicine as told by your doctor. Some medicines are safe and some are not during pregnancy.  Exercise only as told by your doctor. Stop exercising if you start having cramps.  Eat regular, healthy meals.  Wear a good support bra if your breasts are tender.  Do not use hot tubs, steam rooms, or saunas.  Wear your seat belt when driving.  Avoid raw meat, uncooked cheese, and liter boxes and soil used by cats.  Take your prenatal vitamins.  Take 1500-2000 milligrams of calcium daily starting at the 20th week of pregnancy until you deliver your baby.  Try taking medicine that helps you poop (stool softener) as needed, and if your doctor approves. Eat more fiber by eating fresh fruit, vegetables, and whole grains. Drink enough fluids to keep your pee (urine) clear or pale yellow.  Take warm water baths (sitz baths) to soothe pain or discomfort caused by hemorrhoids. Use hemorrhoid cream if your doctor approves.  If you have puffy, bulging veins (varicose veins), wear support hose. Raise (elevate) your feet for 15 minutes, 3-4 times a day. Limit salt in your diet.  Avoid heavy  lifting, wear low heals, and sit up straight.  Rest with your legs raised if you have leg cramps or low back pain.  Visit your dentist if you have not gone during your pregnancy. Use a soft toothbrush to brush your teeth. Be gentle when you floss.  You can have sex (intercourse) unless your doctor tells you not to.  Go to your doctor visits. Get help if:  You feel dizzy.  You have mild cramps or pressure in your lower belly (abdomen).  You have a nagging pain in your belly area.  You continue to feel sick to your stomach (nauseous), throw up (vomit), or have watery poop (diarrhea).  You have bad smelling fluid coming from your vagina.  You have pain with peeing (urination). Get help right away if:  You have a fever.  You are leaking fluid from your vagina.  You have spotting or bleeding from your vagina.  You have severe belly cramping or pain.  You lose or gain weight rapidly.  You have trouble catching your breath and have chest pain.  You notice sudden or extreme puffiness (swelling) of your face, hands, ankles, feet, or legs.  You have not felt the baby move in over an hour.  You have severe headaches that do not go away with medicine.  You have vision changes. This information is not intended to replace advice given to you by your health care provider. Make sure you discuss any questions you have with your health care   provider. Document Released: 04/26/2009 Document Revised: 07/08/2015 Document Reviewed: 04/02/2012 Elsevier Interactive Patient Education  2017 Elsevier Inc.  

## 2016-07-12 NOTE — Progress Notes (Signed)
Patient presents for ROB/17P.  Makena given in RUOQ. Tolerated well.  Administrations This Visit    hydroxyprogesterone caproate (MAKENA) 250 mg/mL injection 250 mg    Admin Date 07/12/2016 Action Given Dose 250 mg Route Intramuscular Administered By Maretta BeesMcGlashan, Zorian Gunderman J, RMA

## 2016-07-15 LAB — AFP TETRA
DIA Mom Value: 0.58
DIA VALUE (EIA): 102.48 pg/mL
DSR (By Age)    1 IN: 656
DSR (Second Trimester) 1 IN: 9365
GESTATIONAL AGE AFP: 20.2 wk
MSAFP Mom: 0.81
MSAFP: 44.5 ng/mL
MSHCG MOM: 0.76
MSHCG: 15462 m[IU]/mL
Maternal Age At EDD: 30.5 yr
Osb Risk: 10000
Test Results:: NEGATIVE
UE3 MOM: 0.89
Weight: 187 [lb_av]
uE3 Value: 1.55 ng/mL

## 2016-07-19 ENCOUNTER — Ambulatory Visit (INDEPENDENT_AMBULATORY_CARE_PROVIDER_SITE_OTHER): Payer: Medicaid Other

## 2016-07-19 DIAGNOSIS — O09212 Supervision of pregnancy with history of pre-term labor, second trimester: Secondary | ICD-10-CM

## 2016-07-19 DIAGNOSIS — Z8751 Personal history of pre-term labor: Secondary | ICD-10-CM

## 2016-07-19 MED ORDER — HYDROXYPROGESTERONE CAPROATE 250 MG/ML IM OIL
250.0000 mg | TOPICAL_OIL | Freq: Once | INTRAMUSCULAR | Status: AC
  Administered 2016-07-19: 250 mg via INTRAMUSCULAR

## 2016-07-19 NOTE — Progress Notes (Signed)
Nurse visit for pt supplied 17p given L upper outer quad w/o difficulty.  

## 2016-07-21 ENCOUNTER — Encounter (HOSPITAL_COMMUNITY): Payer: Self-pay | Admitting: Certified Nurse Midwife

## 2016-07-21 ENCOUNTER — Inpatient Hospital Stay (HOSPITAL_COMMUNITY)
Admission: AD | Admit: 2016-07-21 | Discharge: 2016-07-21 | Disposition: A | Discharge status: Home / Self Care | Payer: Medicaid Other | Source: Ambulatory Visit | Attending: Obstetrics and Gynecology | Admitting: Obstetrics and Gynecology

## 2016-07-21 DIAGNOSIS — M329 Systemic lupus erythematosus, unspecified: Secondary | ICD-10-CM | POA: Insufficient documentation

## 2016-07-21 DIAGNOSIS — O99119 Other diseases of the blood and blood-forming organs and certain disorders involving the immune mechanism complicating pregnancy, unspecified trimester: Secondary | ICD-10-CM

## 2016-07-21 DIAGNOSIS — Z3A21 21 weeks gestation of pregnancy: Secondary | ICD-10-CM | POA: Diagnosis not present

## 2016-07-21 DIAGNOSIS — Z833 Family history of diabetes mellitus: Secondary | ICD-10-CM | POA: Diagnosis not present

## 2016-07-21 DIAGNOSIS — O9989 Other specified diseases and conditions complicating pregnancy, childbirth and the puerperium: Secondary | ICD-10-CM | POA: Diagnosis not present

## 2016-07-21 DIAGNOSIS — Z87891 Personal history of nicotine dependence: Secondary | ICD-10-CM | POA: Insufficient documentation

## 2016-07-21 DIAGNOSIS — Z885 Allergy status to narcotic agent status: Secondary | ICD-10-CM | POA: Insufficient documentation

## 2016-07-21 DIAGNOSIS — M328 Other forms of systemic lupus erythematosus: Secondary | ICD-10-CM | POA: Diagnosis not present

## 2016-07-21 DIAGNOSIS — D6862 Lupus anticoagulant syndrome: Secondary | ICD-10-CM

## 2016-07-21 LAB — URINALYSIS, ROUTINE W REFLEX MICROSCOPIC
Glucose, UA: NEGATIVE mg/dL
Hgb urine dipstick: NEGATIVE
KETONES UR: 5 mg/dL — AB
Leukocytes, UA: NEGATIVE
Nitrite: NEGATIVE
PH: 5 (ref 5.0–8.0)
PROTEIN: 100 mg/dL — AB
Specific Gravity, Urine: 1.032 — ABNORMAL HIGH (ref 1.005–1.030)

## 2016-07-21 MED ORDER — PREDNISONE 10 MG PO TABS
10.0000 mg | ORAL_TABLET | Freq: Every day | ORAL | Status: DC
  Administered 2016-07-21: 10 mg via ORAL
  Filled 2016-07-21: qty 1

## 2016-07-21 MED ORDER — IBUPROFEN 600 MG PO TABS: 600.0000 mg | ORAL_TABLET | Freq: Four times a day (QID) | ORAL | 0 refills | Status: DC | PRN

## 2016-07-21 MED ORDER — PREDNISONE 10 MG PO TABS: 10.0000 mg | ORAL_TABLET | Freq: Every day | ORAL | 2 refills | Status: DC

## 2016-07-21 MED ORDER — IBUPROFEN 600 MG PO TABS
600.0000 mg | ORAL_TABLET | Freq: Once | ORAL | Status: AC
  Administered 2016-07-21: 600 mg via ORAL
  Filled 2016-07-21: qty 1

## 2016-07-21 NOTE — MAU Provider Note (Signed)
History     CSN: [id number]  Arrival date and time: 07/21/16 2036   First Provider Initiated Contact with Patient 07/21/16 2118      Chief Complaint  Patient presents with  . lupus flare up   HPI Ms. Lindsey [name] is a 31 y.o. [id number] at 8763w4d who presents to MAU today with complaint of all over body and joint aches consistent with Lupus flare up. She states that she took Tylenol earlier today for headache and rested. She denies headache now, but continues to have body aches. She states that she also felt warm earlier, but did not take her temperature. She is currently taking Plaquenil but is not on steroids. She reports normal fetal movement. She denies vaginal bleeding, LOF or contractions. [facility]. She is on 17-P for history of PTD at 36 weeks. She rates pain currently at 6/10. She is waiting for [facility] to set up a referral to Rheumatology since she is new to the area.   OB History    Gravida Para Term Preterm AB Living   4 2 1 1 1 2    SAB TAB Ectopic Multiple Live Births   0 1     2      Past Medical History:  Diagnosis Date  . Anemia   . Asthma   . Medical history non-contributory     Past Surgical History:  Procedure Laterality Date  . CHOLECYSTECTOMY    . TOOTH EXTRACTION      Family History  Problem Relation Age of Onset  . Diabetes Mother   . Diabetes Paternal Grandmother     Social History  Substance Use Topics  . Smoking status: Former Smoker    Quit date: 04/18/2011  . Smokeless tobacco: Never Used  . Alcohol use No    Allergies:  Allergies  Allergen Reactions  . Bee Venom Anaphylaxis  . Pineapple Shortness Of Breath and Itching  . Codeine Nausea And Vomiting    No prescriptions prior to admission.    Review of Systems  Constitutional: Negative for fever.  Gastrointestinal: Positive for nausea. Negative for abdominal pain, constipation, diarrhea and vomiting.  Genitourinary: Negative for vaginal  bleeding and vaginal discharge.  Musculoskeletal: Positive for arthralgias.   Physical Exam   Blood pressure 116/66, pulse 68, temperature 99.1 F (37.3 C), temperature source Oral, resp. rate 18, height 5\' 4"  (1.626 m), weight 186 lb (84.4 kg), last menstrual period 02/21/2016, SpO2 100 %.  Physical Exam  Nursing note and vitals reviewed. Constitutional: She is oriented to person, place, and time. She appears well-developed and well-nourished. No distress.  HENT:  Head: Normocephalic and atraumatic.  Cardiovascular: Normal rate.   Respiratory: Effort normal.  GI: Soft. She exhibits no distension and no mass. There is no tenderness. There is no rebound and no guarding.  Neurological: She is alert and oriented to person, place, and time.  Skin: Skin is warm and dry. No erythema.  Psychiatric: She has a normal mood and affect.    Results for orders placed or performed during the hospital encounter of 07/21/16 (from the past 24 hour(s))  Urinalysis, Routine w reflex microscopic     Status: Abnormal   Collection Time: 07/21/16  9:00 PM  Result Value Ref Range   Color, Urine AMBER (A) YELLOW   APPearance HAZY (A) CLEAR   Specific Gravity, Urine 1.032 (H) 1.005 - 1.030   pH 5.0 5.0 - 8.0   Glucose, UA NEGATIVE NEGATIVE  mg/dL   Hgb urine dipstick NEGATIVE NEGATIVE   Bilirubin Urine SMALL (A) NEGATIVE   Ketones, ur 5 (A) NEGATIVE mg/dL   Protein, ur 409 (A) NEGATIVE mg/dL   Nitrite NEGATIVE NEGATIVE   Leukocytes, UA NEGATIVE NEGATIVE   RBC / HPF 0-5 0 - 5 RBC/hpf   WBC, UA 0-5 0 - 5 WBC/hpf   Bacteria, UA RARE (A) NONE SEEN   Squamous Epithelial / LPF 0-5 (A) NONE SEEN   Mucous PRESENT     MAU Course  Procedures None  MDM FHR - 164 bpm with doppler Discussed patient with Dr. Emelda Fear. Agrees with plan for short course of NSAIDs and start daily Prednisone 10 mg.   Assessment and Plan  A: SIUP at [redacted]w[redacted]d SLE  P:  Discharge home Rx for Ibuprofen and Prednisone given to  patient Warning signs for worsening condition discussed Patient advised to follow-up with [facility] as scheduled for routine prenatal care or sooner if symptoms worsen In-basket message sent to [facility] clinical pool to set up Rheumatology referral  Patient may return to MAU as needed or if her condition were to change or worsen   Vonzella Nipple, PA-C 07/21/2016, 10:52 PM

## 2016-07-21 NOTE — MAU Note (Addendum)
Feel like my lupus is flaring up. Feel like my body is one big tooth ache -very achy joints, fatigued, headaches. THis is how I feel with flare up except do not have rash. Symptoms present since yesterday. No headache now as I took tylenol earlier

## 2016-07-21 NOTE — Discharge Instructions (Signed)
Systemic Lupus Erythematosus, Adult Systemic lupus erythematosus is a long-term (chronic) disease that can affect many parts of the body. It can damage the skin, joints, blood vessels, brain, kidneys, lungs, heart, and other internal organs. It causes pain, irritation, and inflammation. Systemic lupus erythematosus is an autoimmune disease. With this type of disease, the body's defense system (immune system) mistakenly attacks normal tissues instead of attacking germs or abnormal growths. What are the causes? The cause of this condition is not known. What increases the risk? This condition is more likely to develop in:  Females.  People of Asian descent.  People of African-American descent.  People who have a family history of the condition.  What are the signs or symptoms? General symptoms include:  Joint pain and swelling (common).  Fever.  Fatigue.  Unusual weight loss or weight gain.  Skin rashes, especially over the nose and cheeks (butterfly rash) and after sun exposure.  Sores inside the mouth or nose.  Other symptoms depend on which parts of the body are affected. They can include:  Shortness of breath.  Chest pain.  Frequent urination.  Blood in the urine.  Seizures.  Mental changes.  Hair loss.  Swollen and tender lymph nodes.  Swelling of the hands or feet.  Symptoms can come and go. A period of time when symptoms get worse or come back is called a flare. A period of time with no symptoms is called a remission. How is this diagnosed? This condition is diagnosed based on symptoms, a medical history, and a physical exam. You may also have tests, including:  Blood tests.  Urine tests.  A chest X-ray.  A skin or kidney biopsy. For this test, a sample of tissue is taken from the skin or kidney and studied under a microscope.  You may be referred to an autoimmune disease specialist (rheumatologist). How is this treated? There is no cure for this  condition, but treatment can keep the disease in remission, help to control symptoms, and prevent damage to the heart, lungs, kidneys, and other organs. Treatment may involve taking a combination of medicines over time. Follow these instructions at home: Medicines  Take medicines only as directed by your health care provider.  Do not take any medicines that contain estrogen without first checking with your health care provider. Estrogen can trigger flares and may increase your risk for blood clots. Lifestyle  Eat a heart-healthy diet.  Stay active as directed by your health care provider.  Do not smoke. If you need help quitting, ask your health care provider.  Protect your skin from the sun by applying sunblock and wearing protective hats and clothing.  Learn as much as you can about your condition and have a good support system in place. Support may come from family, friends, or a lupus support group. General instructions  Keep all follow-up visits as directed by your health care provider. This is important.  Work closely with all of your health care providers to manage your condition.  Let your health care provider know right away if you become pregnant or if you plan to become pregnant. Pregnancy in women with this condition is considered high risk. Contact a health care provider if:  You have a fever.  Your symptoms flare.  You develop new symptoms.  You develop swollen feet or hands.  You develop puffiness around your eyes.  Your medicines are not working.  You have bloody, foamy, or coffee-colored urine.  There are changes in your   urination. For example, you urinate more often at night.  You think that you may be depressed or have anxiety. Get help right away if:  You have chest pain.  You have trouble breathing.  You have a seizure.  You suddenly get a very bad headache.  You suddenly develop facial or body weakness.  You cannot speak.  You cannot  understand speech. This information is not intended to replace advice given to you by your health care provider. Make sure you discuss any questions you have with your health care provider. Document Released: 01/20/2002 Document Revised: 09/26/2015 Document Reviewed: 01/07/2014 Elsevier Interactive Patient Education  2018 Elsevier Inc.  

## 2016-07-25 ENCOUNTER — Encounter (HOSPITAL_COMMUNITY): Payer: Self-pay

## 2016-07-26 ENCOUNTER — Ambulatory Visit (INDEPENDENT_AMBULATORY_CARE_PROVIDER_SITE_OTHER): Payer: Medicaid Other | Admitting: *Deleted

## 2016-07-26 VITALS — BP 105/71 | HR 82 | Wt 187.0 lb

## 2016-07-26 DIAGNOSIS — O09212 Supervision of pregnancy with history of pre-term labor, second trimester: Secondary | ICD-10-CM

## 2016-07-26 DIAGNOSIS — O0990 Supervision of high risk pregnancy, unspecified, unspecified trimester: Secondary | ICD-10-CM

## 2016-07-26 MED ORDER — HYDROXYPROGESTERONE CAPROATE 250 MG/ML IM OIL
250.0000 mg | TOPICAL_OIL | INTRAMUSCULAR | Status: AC
  Administered 2016-07-26 – 2016-09-21 (×5): 250 mg via INTRAMUSCULAR

## 2016-07-26 NOTE — Progress Notes (Signed)
Pt is in office for 17p injection, pt supplied medication.  Pt tolerated injection well. Pt states [facility]. Pt states she was given Prednisone and has been taking since. Pt states she does feel some better. Pt was advised to continue Rx as directed and if symptoms worsen or do not improve to contact office for sooner appt or be [facility].  Pt states understanding.  BP 105/71   Pulse 82   Wt 187 lb (84.8 kg)   LMP 02/21/2016 (Exact Date)   BMI 32.10 kg/m   Administrations This Visit    hydroxyprogesterone caproate (MAKENA) 250 mg/mL injection 250 mg    Admin Date 07/26/2016 Action Given Dose 250 mg Route Intramuscular Administered By Lanney GinsFoster, Anola Mcgough D, CMA

## 2016-07-28 ENCOUNTER — Encounter (HOSPITAL_COMMUNITY): Payer: Self-pay

## 2016-07-28 ENCOUNTER — Ambulatory Visit (HOSPITAL_COMMUNITY)
Admission: RE | Admit: 2016-07-28 | Discharge: 2016-07-28 | Disposition: A | Discharge status: Home / Self Care | Payer: Medicaid Other | Source: Ambulatory Visit | Attending: Certified Nurse Midwife | Admitting: Certified Nurse Midwife

## 2016-07-28 VITALS — BP 109/66 | HR 72 | Wt 187.6 lb

## 2016-07-28 DIAGNOSIS — Z362 Encounter for other antenatal screening follow-up: Secondary | ICD-10-CM | POA: Insufficient documentation

## 2016-07-28 DIAGNOSIS — Z3A22 22 weeks gestation of pregnancy: Secondary | ICD-10-CM | POA: Diagnosis not present

## 2016-07-28 DIAGNOSIS — O99212 Obesity complicating pregnancy, second trimester: Secondary | ICD-10-CM | POA: Insufficient documentation

## 2016-07-28 DIAGNOSIS — O09212 Supervision of pregnancy with history of pre-term labor, second trimester: Secondary | ICD-10-CM | POA: Diagnosis not present

## 2016-07-28 DIAGNOSIS — O26892 Other specified pregnancy related conditions, second trimester: Secondary | ICD-10-CM | POA: Insufficient documentation

## 2016-07-28 DIAGNOSIS — O9989 Other specified diseases and conditions complicating pregnancy, childbirth and the puerperium: Secondary | ICD-10-CM

## 2016-07-28 DIAGNOSIS — M329 Systemic lupus erythematosus, unspecified: Secondary | ICD-10-CM

## 2016-07-28 DIAGNOSIS — O99891 Other specified diseases and conditions complicating pregnancy: Secondary | ICD-10-CM

## 2016-07-28 HISTORY — DX: Systemic lupus erythematosus, unspecified: M32.9

## 2016-07-31 ENCOUNTER — Ambulatory Visit (HOSPITAL_COMMUNITY): Admission: RE | Admit: 2016-07-31 | Payer: Medicaid Other | Source: Ambulatory Visit

## 2016-08-02 ENCOUNTER — Ambulatory Visit (INDEPENDENT_AMBULATORY_CARE_PROVIDER_SITE_OTHER): Payer: Medicaid Other

## 2016-08-02 DIAGNOSIS — O09212 Supervision of pregnancy with history of pre-term labor, second trimester: Secondary | ICD-10-CM | POA: Diagnosis not present

## 2016-08-02 DIAGNOSIS — O0990 Supervision of high risk pregnancy, unspecified, unspecified trimester: Secondary | ICD-10-CM

## 2016-08-02 MED ORDER — HYDROXYPROGESTERONE CAPROATE 250 MG/ML IM OIL: 250.0000 mg | TOPICAL_OIL | Freq: Once | INTRAMUSCULAR | Status: DC

## 2016-08-09 ENCOUNTER — Ambulatory Visit (INDEPENDENT_AMBULATORY_CARE_PROVIDER_SITE_OTHER): Payer: Medicaid Other | Admitting: Obstetrics and Gynecology

## 2016-08-09 VITALS — BP 110/74 | HR 99 | Wt 188.3 lb

## 2016-08-09 DIAGNOSIS — O0992 Supervision of high risk pregnancy, unspecified, second trimester: Secondary | ICD-10-CM

## 2016-08-09 DIAGNOSIS — K117 Disturbances of salivary secretion: Secondary | ICD-10-CM | POA: Insufficient documentation

## 2016-08-09 DIAGNOSIS — O0990 Supervision of high risk pregnancy, unspecified, unspecified trimester: Secondary | ICD-10-CM

## 2016-08-09 DIAGNOSIS — K219 Gastro-esophageal reflux disease without esophagitis: Secondary | ICD-10-CM | POA: Insufficient documentation

## 2016-08-09 DIAGNOSIS — O09212 Supervision of pregnancy with history of pre-term labor, second trimester: Secondary | ICD-10-CM

## 2016-08-09 DIAGNOSIS — D6862 Lupus anticoagulant syndrome: Secondary | ICD-10-CM

## 2016-08-09 DIAGNOSIS — O99119 Other diseases of the blood and blood-forming organs and certain disorders involving the immune mechanism complicating pregnancy, unspecified trimester: Secondary | ICD-10-CM

## 2016-08-09 DIAGNOSIS — Z8751 Personal history of pre-term labor: Secondary | ICD-10-CM

## 2016-08-09 MED ORDER — PANTOPRAZOLE SODIUM 20 MG PO TBEC: 20.0000 mg | DELAYED_RELEASE_TABLET | Freq: Every day | ORAL | 6 refills | Status: DC

## 2016-08-09 MED ORDER — GLYCOPYRROLATE 2 MG PO TABS: 2.0000 mg | ORAL_TABLET | Freq: Three times a day (TID) | ORAL | 3 refills | Status: DC | PRN

## 2016-08-09 MED ORDER — PROMETHAZINE HCL 25 MG PO TABS: 25.0000 mg | ORAL_TABLET | Freq: Four times a day (QID) | ORAL | 0 refills | Status: DC | PRN

## 2016-08-09 NOTE — Progress Notes (Signed)
Subjective:  Lindsey [name] is a 31 y.o. [id number] at 6042w2d being seen today for ongoing prenatal care.  She is currently monitored for the following issues for this high-risk pregnancy and has Supervision of high risk pregnancy, antepartum; Medical history non-contributory; History of lupus; Current pregnancy with history of preterm labor; Low grade squamous intraepithelial lesion (LGSIL) on cervical Pap smear; Low vitamin D level; Lupus anticoagulant complicating pregnancy, antepartum (HCC); Systemic lupus complicating pregnancy (HCC); History of preterm delivery; Ptyalism; and GERD (gastroesophageal reflux disease) on her problem list.  Patient reports excessive salvia, nausea and GERD.Marland Kitchen.  Contractions: Not present. Vag. Bleeding: None.  Movement: Present. Denies leaking of fluid.   The following portions of the patient's history were reviewed and updated as appropriate: allergies, current medications, past family history, past medical history, past social history, past surgical history and problem list. Problem list updated.  Objective:   Vitals:   08/09/16 1014  BP: 110/74  Pulse: 99  Weight: 188 lb 4.8 oz (85.4 kg)    Fetal Status: Fetal Heart Rate (bpm): 152   Movement: Present     General:  Alert, oriented and cooperative. Patient is in no acute distress.  Skin: Skin is warm and dry. No rash noted.   Cardiovascular: Normal heart rate noted  Respiratory: Normal respiratory effort, no problems with respiration noted  Abdomen: Soft, gravid, appropriate for gestational age. Pain/Pressure: Absent     Pelvic:  Cervical exam deferred        Extremities: Normal range of motion.  Edema: None  Mental Status: Normal mood and affect. Normal behavior. Normal judgment and thought content.   Urinalysis:      Assessment and Plan:  Pregnancy: [id number] at 6942w2d  1. Supervision of high risk pregnancy, antepartum Stable  2. Lupus anticoagulant complicating pregnancy, antepartum (HCC) Continue  with Plaquenil. Prednisone add after 07/21/16 MAU visit. No more joint pain. Has appt with Rheumatology 08/29/16 - Complement, total - Protein, Urine, 24 hour - Creatinine Clearance, Urine, 24 hour  3. Current pregnancy with history of pre-term labor in second trimester Continue with 17 OHP weekly  4. History of preterm delivery See # 3  5. Ptyalism  - glycopyrrolate (ROBINUL) 2 MG tablet; Take 1 tablet (2 mg total) by mouth 3 (three) times daily as needed.  Dispense: 30 tablet; Refill: 3  6. Gastroesophageal reflux disease without esophagitis  - promethazine (PHENERGAN) 25 MG tablet; Take 1 tablet (25 mg total) by mouth every 6 (six) hours as needed for nausea or vomiting.  Dispense: 30 tablet; Refill: 0 - pantoprazole (PROTONIX) 20 MG tablet; Take 1 tablet (20 mg total) by mouth daily.  Dispense: 30 tablet; Refill: 6  Preterm labor symptoms and general obstetric precautions including but not limited to vaginal bleeding, contractions, leaking of fluid and fetal movement were reviewed in detail with the patient. Please refer to After Visit Summary for other counseling recommendations.  Return in about 2 weeks (around 08/23/2016).   Hermina StaggersErvin, Corrado Hymon L, MD

## 2016-08-09 NOTE — Progress Notes (Signed)
Patient reports good fetal movement, complains of nausea and heartburn, states that previously prescribed meds are not working. Administered 17p and pt tolerated well.

## 2016-08-11 LAB — COMPLEMENT, TOTAL: COMPL TOTAL (CH50): 13 U/mL (ref >41)

## 2016-08-17 ENCOUNTER — Ambulatory Visit (INDEPENDENT_AMBULATORY_CARE_PROVIDER_SITE_OTHER): Payer: Medicaid Other

## 2016-08-17 DIAGNOSIS — O09212 Supervision of pregnancy with history of pre-term labor, second trimester: Secondary | ICD-10-CM

## 2016-08-17 DIAGNOSIS — Z8751 Personal history of pre-term labor: Secondary | ICD-10-CM

## 2016-08-17 MED ORDER — HYDROXYPROGESTERONE CAPROATE 250 MG/ML IM OIL: 250.0000 mg | TOPICAL_OIL | Freq: Once | INTRAMUSCULAR | Status: DC

## 2016-08-17 NOTE — Progress Notes (Signed)
Nurse visit for pt supplied 17p given L upper outer quad w/o difficulty.  

## 2016-08-22 ENCOUNTER — Inpatient Hospital Stay (HOSPITAL_COMMUNITY)
Admission: AD | Admit: 2016-08-22 | Discharge: 2016-08-23 | Disposition: A | Discharge status: Home / Self Care | Payer: Medicaid Other | Source: Ambulatory Visit | Attending: Family Medicine | Admitting: Family Medicine

## 2016-08-22 ENCOUNTER — Encounter (HOSPITAL_COMMUNITY): Payer: Self-pay | Admitting: *Deleted

## 2016-08-22 DIAGNOSIS — O99112 Other diseases of the blood and blood-forming organs and certain disorders involving the immune mechanism complicating pregnancy, second trimester: Secondary | ICD-10-CM | POA: Diagnosis not present

## 2016-08-22 DIAGNOSIS — D6862 Lupus anticoagulant syndrome: Secondary | ICD-10-CM | POA: Diagnosis not present

## 2016-08-22 DIAGNOSIS — Z87891 Personal history of nicotine dependence: Secondary | ICD-10-CM | POA: Diagnosis not present

## 2016-08-22 DIAGNOSIS — O4702 False labor before 37 completed weeks of gestation, second trimester: Secondary | ICD-10-CM | POA: Diagnosis not present

## 2016-08-22 DIAGNOSIS — O479 False labor, unspecified: Secondary | ICD-10-CM

## 2016-08-22 DIAGNOSIS — Z3A26 26 weeks gestation of pregnancy: Secondary | ICD-10-CM | POA: Insufficient documentation

## 2016-08-22 DIAGNOSIS — O26892 Other specified pregnancy related conditions, second trimester: Secondary | ICD-10-CM | POA: Diagnosis not present

## 2016-08-22 DIAGNOSIS — M329 Systemic lupus erythematosus, unspecified: Secondary | ICD-10-CM | POA: Diagnosis not present

## 2016-08-22 DIAGNOSIS — O99512 Diseases of the respiratory system complicating pregnancy, second trimester: Secondary | ICD-10-CM | POA: Diagnosis not present

## 2016-08-22 DIAGNOSIS — M549 Dorsalgia, unspecified: Secondary | ICD-10-CM | POA: Insufficient documentation

## 2016-08-22 DIAGNOSIS — Z7982 Long term (current) use of aspirin: Secondary | ICD-10-CM | POA: Diagnosis not present

## 2016-08-22 DIAGNOSIS — O99012 Anemia complicating pregnancy, second trimester: Secondary | ICD-10-CM | POA: Diagnosis not present

## 2016-08-22 DIAGNOSIS — O9989 Other specified diseases and conditions complicating pregnancy, childbirth and the puerperium: Secondary | ICD-10-CM | POA: Insufficient documentation

## 2016-08-22 DIAGNOSIS — J45909 Unspecified asthma, uncomplicated: Secondary | ICD-10-CM | POA: Insufficient documentation

## 2016-08-22 DIAGNOSIS — Z79899 Other long term (current) drug therapy: Secondary | ICD-10-CM | POA: Insufficient documentation

## 2016-08-22 DIAGNOSIS — O4700 False labor before 37 completed weeks of gestation, unspecified trimester: Secondary | ICD-10-CM

## 2016-08-22 DIAGNOSIS — O99119 Other diseases of the blood and blood-forming organs and certain disorders involving the immune mechanism complicating pregnancy, unspecified trimester: Secondary | ICD-10-CM

## 2016-08-22 DIAGNOSIS — O99891 Other specified diseases and conditions complicating pregnancy: Secondary | ICD-10-CM

## 2016-08-22 LAB — URINALYSIS, ROUTINE W REFLEX MICROSCOPIC
BILIRUBIN URINE: NEGATIVE
Glucose, UA: NEGATIVE mg/dL
HGB URINE DIPSTICK: NEGATIVE
Ketones, ur: NEGATIVE mg/dL
Leukocytes, UA: NEGATIVE
Nitrite: NEGATIVE
PH: 8 (ref 5.0–8.0)
Protein, ur: NEGATIVE mg/dL
Specific Gravity, Urine: 1.011 (ref 1.005–1.030)

## 2016-08-22 MED ORDER — LACTATED RINGERS IV BOLUS (SEPSIS)
1000.0000 mL | Freq: Once | INTRAVENOUS | Status: AC
  Administered 2016-08-22: 1000 mL via INTRAVENOUS

## 2016-08-22 MED ORDER — NIFEDIPINE 10 MG PO CAPS
10.0000 mg | ORAL_CAPSULE | ORAL | Status: AC | PRN
  Administered 2016-08-22 (×3): 10 mg via ORAL
  Filled 2016-08-22 (×3): qty 1

## 2016-08-22 NOTE — MAU Note (Signed)
Pt started having contractions around 2000 today.  Rates her pain a 7/10.  Feels "like baby is pressing down on vagina" pain in abdomen and her back.. Denies LOF/VB.

## 2016-08-22 NOTE — MAU Provider Note (Signed)
History     CSN: [id number]  Arrival date and time: 08/22/16 2133   First Provider Initiated Contact with Patient 08/22/16 2207      Chief Complaint  Patient presents with  . Contractions   HPI Lindsey [name] is a 31 y.o. [id number] at [redacted]w[redacted]d who presents complaining of contractions that started at 2000. She states she will have contractions back to back and then a break. She rates them at 7/10 and has not tried anything for the pain. She states she has tried to drink water to relieve the contractions but it is not helping. She reports last intercourse around midnight last night. She states this happened with both of her previous pregnancies but cannot remember what medicine they gave her. Currently she is on 17P injections in the office.   OB History    Gravida Para Term Preterm AB Living   4 2 1 1 1 2    SAB TAB Ectopic Multiple Live Births   0 1     2      Past Medical History:  Diagnosis Date  . Anemia   . Asthma   . Medical history non-contributory   . Systemic lupus erythematosus (HCC)     Past Surgical History:  Procedure Laterality Date  . CHOLECYSTECTOMY    . TOOTH EXTRACTION      Family History  Problem Relation Age of Onset  . Diabetes Mother   . Diabetes Paternal Grandmother     Social History  Substance Use Topics  . Smoking status: Former Smoker    Quit date: 04/18/2011  . Smokeless tobacco: Never Used  . Alcohol use No    Allergies:  Allergies  Allergen Reactions  . Bee Venom Anaphylaxis  . Pineapple Shortness Of Breath and Itching  . Codeine Nausea And Vomiting    Facility-Administered Medications Prior to Admission  Medication Dose Route Frequency Provider Last Rate Last Dose  . hydroxyprogesterone caproate (MAKENA) 250 mg/mL injection 250 mg  250 mg Intramuscular Weekly Adam Phenix, MD   250 mg at 08/17/16 1640  . hydroxyprogesterone caproate (MAKENA) 250 mg/mL injection 250 mg  250 mg Intramuscular Once Hermina Staggers, MD      .  hydroxyprogesterone caproate (MAKENA) 250 mg/mL injection 250 mg  250 mg Intramuscular Once Adam Phenix, MD       Prescriptions Prior to Admission  Medication Sig Dispense Refill Last Dose  . acetaminophen (TYLENOL) 325 MG tablet Take 650 mg by mouth every 6 (six) hours as needed for moderate pain.   Taking  . aspirin 81 MG chewable tablet Chew 1 tablet (81 mg total) by mouth daily. 30 tablet 12 Taking  . Ca Carbonate-Mag Hydroxide (ROLAIDS PO) Take 2 tablets by mouth 3 (three) times daily as needed (heartburn).   Taking  . Doxylamine-Pyridoxine (DICLEGIS) 10-10 MG TBEC Take 1 tablet with breakfast and lunch.  Take 2 tablets at bedtime. 100 tablet 4 Taking  . glycopyrrolate (ROBINUL) 2 MG tablet Take 1 tablet (2 mg total) by mouth 3 (three) times daily as needed. 30 tablet 3   . hydroxychloroquine (PLAQUENIL) 200 MG tablet Take 1 tablet (200 mg total) by mouth daily. 30 tablet 6 Taking  . pantoprazole (PROTONIX) 20 MG tablet Take 1 tablet (20 mg total) by mouth daily. 30 tablet 6   . predniSONE (DELTASONE) 10 MG tablet Take 1 tablet (10 mg total) by mouth daily with breakfast. 30 tablet 2 Taking  . Prenat-FeAsp-Meth-FA-DHA w/o A (PRENATE PIXIE) 10-0.6-0.4-200 MG  CAPS Take 1 tablet by mouth daily. 30 capsule 12 Taking  . promethazine (PHENERGAN) 25 MG tablet Take 1 tablet (25 mg total) by mouth every 6 (six) hours as needed for nausea or vomiting. 30 tablet 0   . Vitamin D, Ergocalciferol, (DRISDOL) 50000 units CAPS capsule Take 1 capsule (50,000 Units total) by mouth every 7 (seven) days. 30 capsule 2 Taking    Review of Systems  Constitutional: Negative.  Negative for chills and fever.  HENT: Negative.   Respiratory: Negative.  Negative for shortness of breath.   Cardiovascular: Negative.  Negative for chest pain.  Gastrointestinal: Positive for abdominal pain and constipation. Negative for diarrhea, nausea and vomiting.  Genitourinary: Negative.  Negative for dysuria, vaginal bleeding  and vaginal discharge.  Neurological: Negative.  Negative for dizziness and headaches.  Psychiatric/Behavioral: Negative.    Physical Exam   Blood pressure 118/69, pulse 79, temperature 98.1 F (36.7 C), temperature source Oral, resp. rate 17, last menstrual period 02/21/2016, SpO2 100 %.  Physical Exam  Nursing note and vitals reviewed. Constitutional: She appears well-developed and well-nourished.  HENT:  Head: Normocephalic and atraumatic.  Eyes: Conjunctivae are normal. No scleral icterus.  Cardiovascular: Normal rate, regular rhythm and normal heart sounds.   Respiratory: Effort normal and breath sounds normal. No respiratory distress.  GI: Soft. She exhibits no distension. There is no tenderness.  Neurological: She is alert.  Skin: Skin is warm and dry.  Psychiatric: She has a normal mood and affect. Her behavior is normal. Judgment and thought content normal.   Dilation: 1 Effacement (%): 50 Cervical Position: Middle Station: Ballotable Exam by:: Ma Hillock. Neill SNM  Fetal Tracing:  Baseline: 135 Variability: moderate Accelerations: 10x10 Decelerations: none  Toco: uterine irritability, no contractions noted on toco or palpation  MAU Course  Procedures Results for orders placed or performed during the hospital encounter of 08/22/16 (from the past 24 hour(s))  Urinalysis, Routine w reflex microscopic     Status: None   Collection Time: 08/22/16  9:47 PM  Result Value Ref Range   Color, Urine YELLOW YELLOW   APPearance CLEAR CLEAR   Specific Gravity, Urine 1.011 1.005 - 1.030   pH 8.0 5.0 - 8.0   Glucose, UA NEGATIVE NEGATIVE mg/dL   Hgb urine dipstick NEGATIVE NEGATIVE   Bilirubin Urine NEGATIVE NEGATIVE   Ketones, ur NEGATIVE NEGATIVE mg/dL   Protein, ur NEGATIVE NEGATIVE mg/dL   Nitrite NEGATIVE NEGATIVE   Leukocytes, UA NEGATIVE NEGATIVE   MDM UA LR bolus Procardia 10mg  PO x3 doses q20 minutes PRN for contractions Reassuring NST Unable to do FFN due to  recent intercourse Patient reports no more contractions after procardia and fluids, rates pain a 0/10. No cervical change after 2 hours, no contractions noted on monitoring or with palpation Assessment and Plan   1. Preterm contractions   2. Lupus anticoagulant complicating pregnancy, antepartum (HCC)   3. Braxton Hick's contraction   4. Back pain in pregnancy    -Discharge patient home in stable condition -Prescription for Procardia sent to patient's pharmacy -Follow up tomorrow in office with Dr. Debroah LoopArnold as scheduled for prenatal care and 17P -Preterm labor precautions reviewed. -Encouraged to return here or to other Urgent Care/ED if she develops worsening of symptoms, increase in pain, fever, or other concerning symptoms.   Cleone SlimCaroline Neill SNM 08/23/2016, 12:21 AM   I have seen this patient, performed the physical exam and agree with the above midwife student's note.  LEFTWICH-KIRBY, Osher Oettinger Certified Nurse-Midwife

## 2016-08-23 ENCOUNTER — Ambulatory Visit (INDEPENDENT_AMBULATORY_CARE_PROVIDER_SITE_OTHER): Payer: Medicaid Other | Admitting: Obstetrics & Gynecology

## 2016-08-23 VITALS — BP 107/68 | HR 91 | Wt 194.6 lb

## 2016-08-23 DIAGNOSIS — O99891 Other specified diseases and conditions complicating pregnancy: Secondary | ICD-10-CM

## 2016-08-23 DIAGNOSIS — O9989 Other specified diseases and conditions complicating pregnancy, childbirth and the puerperium: Secondary | ICD-10-CM

## 2016-08-23 DIAGNOSIS — O0990 Supervision of high risk pregnancy, unspecified, unspecified trimester: Secondary | ICD-10-CM

## 2016-08-23 DIAGNOSIS — M329 Systemic lupus erythematosus, unspecified: Secondary | ICD-10-CM

## 2016-08-23 DIAGNOSIS — O09212 Supervision of pregnancy with history of pre-term labor, second trimester: Secondary | ICD-10-CM | POA: Diagnosis not present

## 2016-08-23 DIAGNOSIS — Z8751 Personal history of pre-term labor: Secondary | ICD-10-CM

## 2016-08-23 DIAGNOSIS — O0992 Supervision of high risk pregnancy, unspecified, second trimester: Secondary | ICD-10-CM

## 2016-08-23 MED ORDER — NIFEDIPINE 10 MG PO CAPS: 10.0000 mg | ORAL_CAPSULE | ORAL | 2 refills | Status: DC | PRN

## 2016-08-23 MED ORDER — HYDROXYPROGESTERONE CAPROATE 250 MG/ML IM OIL
250.0000 mg | TOPICAL_OIL | Freq: Once | INTRAMUSCULAR | Status: AC
  Administered 2016-08-23: 250 mg via INTRAMUSCULAR

## 2016-08-23 NOTE — Patient Instructions (Signed)
Third Trimester of Pregnancy The third trimester is from week 28 through week 40 (months 7 through 9). The third trimester is a time when the unborn baby (fetus) is growing rapidly. At the end of the ninth month, the fetus is about 20 inches in length and weighs 6-10 pounds. Body changes during your third trimester Your body will continue to go through many changes during pregnancy. The changes vary from woman to woman. During the third trimester:  Your weight will continue to increase. You can expect to gain 25-35 pounds (11-16 kg) by the end of the pregnancy.  You may begin to get stretch marks on your hips, abdomen, and breasts.  You may urinate more often because the fetus is moving lower into your pelvis and pressing on your bladder.  You may develop or continue to have heartburn. This is caused by increased hormones that slow down muscles in the digestive tract.  You may develop or continue to have constipation because increased hormones slow digestion and cause the muscles that push waste through your intestines to relax.  You may develop hemorrhoids. These are swollen veins (varicose veins) in the rectum that can itch or be painful.  You may develop swollen, bulging veins (varicose veins) in your legs.  You may have increased body aches in the pelvis, back, or thighs. This is due to weight gain and increased hormones that are relaxing your joints.  You may have changes in your hair. These can include thickening of your hair, rapid growth, and changes in texture. Some women also have hair loss during or after pregnancy, or hair that feels dry or thin. Your hair will most likely return to normal after your baby is born.  Your breasts will continue to grow and they will continue to become tender. A yellow fluid (colostrum) may leak from your breasts. This is the first milk you are producing for your baby.  Your belly button may stick out.  You may notice more swelling in your hands,  face, or ankles.  You may have increased tingling or numbness in your hands, arms, and legs. The skin on your belly may also feel numb.  You may feel short of breath because of your expanding uterus.  You may have more problems sleeping. This can be caused by the size of your belly, increased need to urinate, and an increase in your body's metabolism.  You may notice the fetus "dropping," or moving lower in your abdomen (lightening).  You may have increased vaginal discharge.  You may notice your joints feel loose and you may have pain around your pelvic bone.  What to expect at prenatal visits You will have prenatal exams every 2 weeks until week 36. Then you will have weekly prenatal exams. During a routine prenatal visit:  You will be weighed to make sure you and the baby are growing normally.  Your blood pressure will be taken.  Your abdomen will be measured to track your baby's growth.  The fetal heartbeat will be listened to.  Any test results from the previous visit will be discussed.  You may have a cervical check near your due date to see if your cervix has softened or thinned (effaced).  You will be tested for Group B streptococcus. This happens between 35 and 37 weeks.  Your health care provider may ask you:  What your birth plan is.  How you are feeling.  If you are feeling the baby move.  If you have had   any abnormal symptoms, such as leaking fluid, bleeding, severe headaches, or abdominal cramping.  If you are using any tobacco products, including cigarettes, chewing tobacco, and electronic cigarettes.  If you have any questions.  Other tests or screenings that may be performed during your third trimester include:  Blood tests that check for low iron levels (anemia).  Fetal testing to check the health, activity level, and growth of the fetus. Testing is done if you have certain medical conditions or if there are problems during the  pregnancy.  Nonstress test (NST). This test checks the health of your baby to make sure there are no signs of problems, such as the baby not getting enough oxygen. During this test, a belt is placed around your belly. The baby is made to move, and its heart rate is monitored during movement.  What is false labor? False labor is a condition in which you feel small, irregular tightenings of the muscles in the womb (contractions) that usually go away with rest, changing position, or drinking water. These are called Braxton Hicks contractions. Contractions may last for hours, days, or even weeks before true labor sets in. If contractions come at regular intervals, become more frequent, increase in intensity, or become painful, you should see your health care provider. What are the signs of labor?  Abdominal cramps.  Regular contractions that start at 10 minutes apart and become stronger and more frequent with time.  Contractions that start on the top of the uterus and spread down to the lower abdomen and back.  Increased pelvic pressure and dull back pain.  A watery or bloody mucus discharge that comes from the vagina.  Leaking of amniotic fluid. This is also known as your "water breaking." It could be a slow trickle or a gush. Let your health care provider know if it has a color or strange odor. If you have any of these signs, call your health care provider right away, even if it is before your due date. Follow these instructions at home: Medicines  Follow your health care provider's instructions regarding medicine use. Specific medicines may be either safe or unsafe to take during pregnancy.  Take a prenatal vitamin that contains at least 600 micrograms (mcg) of folic acid.  If you develop constipation, try taking a stool softener if your health care provider approves. Eating and drinking  Eat a balanced diet that includes fresh fruits and vegetables, whole grains, good sources of protein  such as meat, eggs, or tofu, and low-fat dairy. Your health care provider will help you determine the amount of weight gain that is right for you.  Avoid raw meat and uncooked cheese. These carry germs that can cause birth defects in the baby.  If you have low calcium intake from food, talk to your health care provider about whether you should take a daily calcium supplement.  Eat four or five small meals rather than three large meals a day.  Limit foods that are high in fat and processed sugars, such as fried and sweet foods.  To prevent constipation: ? Drink enough fluid to keep your urine clear or pale yellow. ? Eat foods that are high in fiber, such as fresh fruits and vegetables, whole grains, and beans. Activity  Exercise only as directed by your health care provider. Most women can continue their usual exercise routine during pregnancy. Try to exercise for 30 minutes at least 5 days a week. Stop exercising if you experience uterine contractions.  Avoid heavy   lifting.  Do not exercise in extreme heat or humidity, or at high altitudes.  Wear low-heel, comfortable shoes.  Practice good posture.  You may continue to have sex unless your health care provider tells you otherwise. Relieving pain and discomfort  Take frequent breaks and rest with your legs elevated if you have leg cramps or low back pain.  Take warm sitz baths to soothe any pain or discomfort caused by hemorrhoids. Use hemorrhoid cream if your health care provider approves.  Wear a good support bra to prevent discomfort from breast tenderness.  If you develop varicose veins: ? Wear support pantyhose or compression stockings as told by your healthcare provider. ? Elevate your feet for 15 minutes, 3-4 times a day. Prenatal care  Write down your questions. Take them to your prenatal visits.  Keep all your prenatal visits as told by your health care provider. This is important. Safety  Wear your seat belt at  all times when driving.  Make a list of emergency phone numbers, including numbers for family, friends, the hospital, and police and fire departments. General instructions  Avoid cat litter boxes and soil used by cats. These carry germs that can cause birth defects in the baby. If you have a cat, ask someone to clean the litter box for you.  Do not travel far distances unless it is absolutely necessary and only with the approval of your health care provider.  Do not use hot tubs, steam rooms, or saunas.  Do not drink alcohol.  Do not use any products that contain nicotine or tobacco, such as cigarettes and e-cigarettes. If you need help quitting, ask your health care provider.  Do not use any medicinal herbs or unprescribed drugs. These chemicals affect the formation and growth of the baby.  Do not douche or use tampons or scented sanitary pads.  Do not cross your legs for long periods of time.  To prepare for the arrival of your baby: ? Take prenatal classes to understand, practice, and ask questions about labor and delivery. ? Make a trial run to the hospital. ? Visit the hospital and tour the maternity area. ? Arrange for maternity or paternity leave through employers. ? Arrange for family and friends to take care of pets while you are in the hospital. ? Purchase a rear-facing car seat and make sure you know how to install it in your car. ? Pack your hospital bag. ? Prepare the baby's nursery. Make sure to remove all pillows and stuffed animals from the baby's crib to prevent suffocation.  Visit your dentist if you have not gone during your pregnancy. Use a soft toothbrush to brush your teeth and be gentle when you floss. Contact a health care provider if:  You are unsure if you are in labor or if your water has broken.  You become dizzy.  You have mild pelvic cramps, pelvic pressure, or nagging pain in your abdominal area.  You have lower back pain.  You have persistent  nausea, vomiting, or diarrhea.  You have an unusual or bad smelling vaginal discharge.  You have pain when you urinate. Get help right away if:  Your water breaks before 37 weeks.  You have regular contractions less than 5 minutes apart before 37 weeks.  You have a fever.  You are leaking fluid from your vagina.  You have spotting or bleeding from your vagina.  You have severe abdominal pain or cramping.  You have rapid weight loss or weight gain.    You have shortness of breath with chest pain.  You notice sudden or extreme swelling of your face, hands, ankles, feet, or legs.  Your baby makes fewer than 10 movements in 2 hours.  You have severe headaches that do not go away when you take medicine.  You have vision changes. Summary  The third trimester is from week 28 through week 40, months 7 through 9. The third trimester is a time when the unborn baby (fetus) is growing rapidly.  During the third trimester, your discomfort may increase as you and your baby continue to gain weight. You may have abdominal, leg, and back pain, sleeping problems, and an increased need to urinate.  During the third trimester your breasts will keep growing and they will continue to become tender. A yellow fluid (colostrum) may leak from your breasts. This is the first milk you are producing for your baby.  False labor is a condition in which you feel small, irregular tightenings of the muscles in the womb (contractions) that eventually go away. These are called Braxton Hicks contractions. Contractions may last for hours, days, or even weeks before true labor sets in.  Signs of labor can include: abdominal cramps; regular contractions that start at 10 minutes apart and become stronger and more frequent with time; watery or bloody mucus discharge that comes from the vagina; increased pelvic pressure and dull back pain; and leaking of amniotic fluid. This information is not intended to replace advice  given to you by your health care provider. Make sure you discuss any questions you have with your health care provider. Document Released: 01/24/2001 Document Revised: 07/08/2015 Document Reviewed: 04/02/2012 Elsevier Interactive Patient Education  2017 [employer]  

## 2016-08-23 NOTE — Discharge Instructions (Signed)

## 2016-08-23 NOTE — Progress Notes (Signed)
   PRENATAL VISIT NOTE  Subjective:  Lindsey [name] is a 31 y.o. [id number] at 3358w2d being seen today for ongoing prenatal care.  She is currently monitored for the following issues for this high-risk pregnancy and has Supervision of high risk pregnancy, antepartum; Medical history non-contributory; History of lupus; Current pregnancy with history of preterm labor; Low grade squamous intraepithelial lesion (LGSIL) on cervical Pap smear; Low vitamin D level; Lupus anticoagulant complicating pregnancy, antepartum (HCC); Systemic lupus complicating pregnancy (HCC); History of preterm delivery; Ptyalism; and GERD (gastroesophageal reflux disease) on her problem list.  Patient reports occasional contractions and pressure, had PTL evaluationlast night.  Contractions: Not present. Vag. Bleeding: None.  Movement: Present. Denies leaking of fluid.   The following portions of the patient's history were reviewed and updated as appropriate: allergies, current medications, past family history, past medical history, past social history, past surgical history and problem list. Problem list updated.  Objective:   Vitals:   08/23/16 1512  BP: 107/68  Pulse: 91  Weight: 194 lb 9.6 oz (88.3 kg)    Fetal Status: Fetal Heart Rate (bpm): 152   Movement: Present     General:  Alert, oriented and cooperative. Patient is in no acute distress.  Skin: Skin is warm and dry. No rash noted.   Cardiovascular: Normal heart rate noted  Respiratory: Normal respiratory effort, no problems with respiration noted  Abdomen: Soft, gravid, appropriate for gestational age. Pain/Pressure: Present     Pelvic:  Cervical exam deferred        Extremities: Normal range of motion.  Edema: None  Mental Status: Normal mood and affect. Normal behavior. Normal judgment and thought content.   Assessment and Plan:  Pregnancy: [id number] at 4658w2d  1. Supervision of high risk pregnancy, antepartum US f/u in 2 days  2. History of preterm  delivery cx was not high risk last night  3. Systemic lupus complicating pregnancy (HCC) Nl BP and no proteiuria  4. Current pregnancy with history of pre-term labor in second trimester 17 p   Preterm labor symptoms and general obstetric precautions including but not limited to vaginal bleeding, contractions, leaking of fluid and fetal movement were reviewed in detail with the patient. Please refer to After Visit Summary for other counseling recommendations.  Return in about 2 weeks (around 09/06/2016).   Scheryl DarterJames Maycel Riffe, MD

## 2016-08-23 NOTE — Progress Notes (Signed)
Pt c/o 17p causing body itching. Pt supplied 17 given R buttock w/o difficulty.

## 2016-08-25 ENCOUNTER — Ambulatory Visit (HOSPITAL_COMMUNITY)
Admission: RE | Admit: 2016-08-25 | Discharge: 2016-08-25 | Disposition: A | Discharge status: Home / Self Care | Payer: Medicaid Other | Source: Ambulatory Visit | Attending: Certified Nurse Midwife | Admitting: Certified Nurse Midwife

## 2016-08-30 ENCOUNTER — Ambulatory Visit: Payer: Medicaid Other

## 2016-09-06 ENCOUNTER — Ambulatory Visit (HOSPITAL_COMMUNITY)
Admission: RE | Admit: 2016-09-06 | Discharge: 2016-09-06 | Disposition: A | Discharge status: Home / Self Care | Payer: Medicaid Other | Source: Ambulatory Visit | Attending: Certified Nurse Midwife | Admitting: Certified Nurse Midwife

## 2016-09-06 ENCOUNTER — Other Ambulatory Visit: Payer: Medicaid Other

## 2016-09-06 ENCOUNTER — Other Ambulatory Visit (HOSPITAL_COMMUNITY): Payer: Self-pay | Admitting: Maternal and Fetal Medicine

## 2016-09-06 ENCOUNTER — Encounter (HOSPITAL_COMMUNITY): Payer: Self-pay

## 2016-09-06 DIAGNOSIS — M329 Systemic lupus erythematosus, unspecified: Secondary | ICD-10-CM | POA: Diagnosis not present

## 2016-09-06 DIAGNOSIS — Z362 Encounter for other antenatal screening follow-up: Secondary | ICD-10-CM | POA: Insufficient documentation

## 2016-09-06 DIAGNOSIS — Z3A28 28 weeks gestation of pregnancy: Secondary | ICD-10-CM

## 2016-09-06 DIAGNOSIS — O09213 Supervision of pregnancy with history of pre-term labor, third trimester: Secondary | ICD-10-CM | POA: Insufficient documentation

## 2016-09-06 DIAGNOSIS — O99891 Other specified diseases and conditions complicating pregnancy: Secondary | ICD-10-CM

## 2016-09-06 DIAGNOSIS — O99213 Obesity complicating pregnancy, third trimester: Secondary | ICD-10-CM | POA: Insufficient documentation

## 2016-09-06 DIAGNOSIS — O9989 Other specified diseases and conditions complicating pregnancy, childbirth and the puerperium: Principal | ICD-10-CM

## 2016-09-06 DIAGNOSIS — O26893 Other specified pregnancy related conditions, third trimester: Secondary | ICD-10-CM | POA: Diagnosis present

## 2016-09-07 ENCOUNTER — Ambulatory Visit (INDEPENDENT_AMBULATORY_CARE_PROVIDER_SITE_OTHER): Payer: Medicaid Other | Admitting: Obstetrics and Gynecology

## 2016-09-07 ENCOUNTER — Other Ambulatory Visit (HOSPITAL_COMMUNITY): Payer: Self-pay | Admitting: *Deleted

## 2016-09-07 ENCOUNTER — Other Ambulatory Visit: Payer: Medicaid Other

## 2016-09-07 VITALS — BP 116/67 | HR 92 | Wt 193.3 lb

## 2016-09-07 DIAGNOSIS — O0993 Supervision of high risk pregnancy, unspecified, third trimester: Secondary | ICD-10-CM

## 2016-09-07 DIAGNOSIS — O9989 Other specified diseases and conditions complicating pregnancy, childbirth and the puerperium: Principal | ICD-10-CM

## 2016-09-07 DIAGNOSIS — O09213 Supervision of pregnancy with history of pre-term labor, third trimester: Secondary | ICD-10-CM

## 2016-09-07 DIAGNOSIS — M329 Systemic lupus erythematosus, unspecified: Secondary | ICD-10-CM

## 2016-09-07 DIAGNOSIS — Z23 Encounter for immunization: Secondary | ICD-10-CM | POA: Diagnosis not present

## 2016-09-07 DIAGNOSIS — D6862 Lupus anticoagulant syndrome: Secondary | ICD-10-CM

## 2016-09-07 DIAGNOSIS — O0990 Supervision of high risk pregnancy, unspecified, unspecified trimester: Secondary | ICD-10-CM

## 2016-09-07 DIAGNOSIS — O99119 Other diseases of the blood and blood-forming organs and certain disorders involving the immune mechanism complicating pregnancy, unspecified trimester: Secondary | ICD-10-CM

## 2016-09-07 MED ORDER — COMFORT FIT MATERNITY SUPP MED MISC: 0 refills | Status: DC

## 2016-09-07 MED ORDER — HYDROXYPROGESTERONE CAPROATE 250 MG/ML IM OIL
250.0000 mg | TOPICAL_OIL | Freq: Once | INTRAMUSCULAR | Status: AC
  Administered 2016-09-07: 250 mg via INTRAMUSCULAR

## 2016-09-07 NOTE — Progress Notes (Signed)
   PRENATAL VISIT NOTE  Subjective:  Lindsey [name] is a 31 y.o. [id number] at 3727w3d being seen today for ongoing prenatal care.  She is currently monitored for the following issues for this high-risk pregnancy and has Supervision of high risk pregnancy, antepartum; Medical history non-contributory; History of lupus; Current pregnancy with history of preterm labor; Low grade squamous intraepithelial lesion (LGSIL) on cervical Pap smear; Low vitamin D level; Lupus anticoagulant complicating pregnancy, antepartum (HCC); Ptyalism; and GERD (gastroesophageal reflux disease) on her problem list.  Patient reports pelvic bone pain with changing of position and ambulation.  Contractions: Not present. Vag. Bleeding: None.  Movement: Present. Denies leaking of fluid.   The following portions of the patient's history were reviewed and updated as appropriate: allergies, current medications, past family history, past medical history, past social history, past surgical history and problem list. Problem list updated.  Objective:   Vitals:   09/07/16 0855  BP: 116/67  Pulse: 92  Weight: 193 lb 4.8 oz (87.7 kg)    Fetal Status: Fetal Heart Rate (bpm): 150 Fundal Height: 28 cm Movement: Present     General:  Alert, oriented and cooperative. Patient is in no acute distress.  Skin: Skin is warm and dry. No rash noted.   Cardiovascular: Normal heart rate noted  Respiratory: Normal respiratory effort, no problems with respiration noted  Abdomen: Soft, gravid, appropriate for gestational age.  Pain/Pressure: Present     Pelvic: Cervical exam deferred        Extremities: Normal range of motion.  Edema: None  Mental Status:  Normal mood and affect. Normal behavior. Normal judgment and thought content.   Assessment and Plan:  Pregnancy: [id number] at 6027w3d  1. Supervision of high risk pregnancy, antepartum Reassurance provided Rx maternity support belt provided Patient desires BTL- consent signed  today Third trimester labs, glucola and tdap today - Glucose Tolerance, 2 Hours w/1 Hour - CBC - HIV antibody - RPR  2. Current pregnancy with history of pre-term labor in third trimester Continue weekly 17-P  3. Lupus anticoagulant complicating pregnancy, antepartum (HCC) Normal growth ultrasound on 7/26 Follow up growth ultrasound already scheduled  Preterm labor symptoms and general obstetric precautions including but not limited to vaginal bleeding, contractions, leaking of fluid and fetal movement were reviewed in detail with the patient. Please refer to After Visit Summary for other counseling recommendations.  No Follow-up on file.   Catalina AntiguaPeggy Arita Severtson, MD

## 2016-09-07 NOTE — Progress Notes (Signed)
Pt c/o pelvic bone pain.

## 2016-09-08 LAB — GLUCOSE TOLERANCE, 2 HOURS W/ 1HR
Glucose, 1 hour: 142 mg/dL (ref 65–179)
Glucose, 2 hour: 121 mg/dL (ref 65–152)
Glucose, Fasting: 75 mg/dL (ref 65–91)

## 2016-09-08 LAB — CBC
HEMATOCRIT: 28.7 % — AB (ref 34.0–46.6)
Hemoglobin: 9.6 g/dL — ABNORMAL LOW (ref 11.1–15.9)
MCH: 32 pg (ref 26.6–33.0)
MCHC: 33.4 g/dL (ref 31.5–35.7)
MCV: 96 fL (ref 79–97)
Platelets: 250 10*3/uL (ref 150–379)
RBC: 3 x10E6/uL — AB (ref 3.77–5.28)
RDW: 13.4 % (ref 12.3–15.4)
WBC: 10 10*3/uL (ref 3.4–10.8)

## 2016-09-08 LAB — HIV ANTIBODY (ROUTINE TESTING W REFLEX): HIV SCREEN 4TH GENERATION: NONREACTIVE

## 2016-09-08 LAB — RPR: RPR Ser Ql: NONREACTIVE

## 2016-09-09 ENCOUNTER — Other Ambulatory Visit: Payer: Self-pay | Admitting: Obstetrics and Gynecology

## 2016-09-09 MED ORDER — FERROUS SULFATE 325 (65 FE) MG PO TABS: 325.0000 mg | ORAL_TABLET | Freq: Two times a day (BID) | ORAL | 1 refills | Status: DC

## 2016-09-13 ENCOUNTER — Ambulatory Visit: Payer: Medicaid Other

## 2016-09-20 ENCOUNTER — Encounter: Payer: Medicaid Other | Admitting: Obstetrics & Gynecology

## 2016-09-21 ENCOUNTER — Ambulatory Visit (INDEPENDENT_AMBULATORY_CARE_PROVIDER_SITE_OTHER): Payer: Medicaid Other | Admitting: Obstetrics and Gynecology

## 2016-09-21 VITALS — BP 120/66 | HR 97 | Wt 194.8 lb

## 2016-09-21 DIAGNOSIS — O99119 Other diseases of the blood and blood-forming organs and certain disorders involving the immune mechanism complicating pregnancy, unspecified trimester: Secondary | ICD-10-CM

## 2016-09-21 DIAGNOSIS — Z3009 Encounter for other general counseling and advice on contraception: Secondary | ICD-10-CM | POA: Insufficient documentation

## 2016-09-21 DIAGNOSIS — O09213 Supervision of pregnancy with history of pre-term labor, third trimester: Secondary | ICD-10-CM | POA: Diagnosis not present

## 2016-09-21 DIAGNOSIS — D6862 Lupus anticoagulant syndrome: Secondary | ICD-10-CM

## 2016-09-21 DIAGNOSIS — O0990 Supervision of high risk pregnancy, unspecified, unspecified trimester: Secondary | ICD-10-CM

## 2016-09-21 MED ORDER — HYDROXYPROGESTERONE CAPROATE 250 MG/ML IM OIL: 250.0000 mg | TOPICAL_OIL | Freq: Once | INTRAMUSCULAR | Status: DC

## 2016-09-21 NOTE — Progress Notes (Signed)
Subjective:  Lindsey [name] is a 31 y.o. [id number] at 2775w3d being seen today for ongoing prenatal care.  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  Patient reports constipation.  Contractions: Not present. Vag. Bleeding: None.  Movement: Present. Denies leaking of fluid.   The following portions of the patient's history were reviewed and updated as appropriate: allergies, current medications, past family history, past medical history, past social history, past surgical history and problem list. Problem list updated.  Objective:   Vitals:   09/21/16 1419  BP: 120/66  Pulse: 97  Weight: 194 lb 12.8 oz (88.4 kg)    Fetal Status: Fetal Heart Rate (bpm): 155   Movement: Present     General:  Alert, oriented and cooperative. Patient is in no acute distress.  Skin: Skin is warm and dry. No rash noted.   Cardiovascular: Normal heart rate noted  Respiratory: Normal respiratory effort, no problems with respiration noted  Abdomen: Soft, gravid, appropriate for gestational age. Pain/Pressure: Present     Pelvic:  Cervical exam deferred        Extremities: Normal range of motion.  Edema: None  Mental Status: Normal mood and affect. Normal behavior. Normal judgment and thought content.   Urinalysis:      Assessment and Plan:  Pregnancy: [id number] at 8575w3d  1. Current pregnancy with history of pre-term labor in third trimester Stable Continue with weekly 17 OHP Has not had to use PRN Procardia - hydroxyprogesterone caproate (MAKENA) 250 mg/mL injection 250  mg; Inject 1 mL (250 mg total) into the muscle once.  2. Lupus anticoagulant complicating pregnancy, antepartum (HCC) Stable  Continue with current management Has to reschedule appt with Rheumatology Start antenatal testing a next visit Good growth, repeat growth scan scheduled 3. Supervision of high risk pregnancy, antepartum Mirlax for constipation and then start stool softener  4. Unwanted fertility BTL papers signed 7/26  Preterm labor symptoms and general obstetric precautions including but not limited to vaginal bleeding, contractions, leaking of fluid and fetal movement were reviewed in detail with the patient. Please refer to After Visit Summary for other counseling recommendations.  Return in about 2 weeks (around 10/05/2016) for OB visit.   Hermina StaggersErvin, Lucy Boardman L, MD

## 2016-10-03 ENCOUNTER — Ambulatory Visit (INDEPENDENT_AMBULATORY_CARE_PROVIDER_SITE_OTHER): Payer: Medicaid Other | Admitting: Obstetrics & Gynecology

## 2016-10-03 ENCOUNTER — Other Ambulatory Visit: Payer: Medicaid Other

## 2016-10-03 VITALS — BP 100/67 | HR 81 | Wt 192.0 lb

## 2016-10-03 DIAGNOSIS — O99119 Other diseases of the blood and blood-forming organs and certain disorders involving the immune mechanism complicating pregnancy, unspecified trimester: Principal | ICD-10-CM

## 2016-10-03 DIAGNOSIS — D6862 Lupus anticoagulant syndrome: Secondary | ICD-10-CM

## 2016-10-03 DIAGNOSIS — K219 Gastro-esophageal reflux disease without esophagitis: Secondary | ICD-10-CM

## 2016-10-03 DIAGNOSIS — O0993 Supervision of high risk pregnancy, unspecified, third trimester: Secondary | ICD-10-CM

## 2016-10-03 DIAGNOSIS — O0990 Supervision of high risk pregnancy, unspecified, unspecified trimester: Secondary | ICD-10-CM

## 2016-10-03 DIAGNOSIS — O09213 Supervision of pregnancy with history of pre-term labor, third trimester: Secondary | ICD-10-CM

## 2016-10-03 DIAGNOSIS — O99113 Other diseases of the blood and blood-forming organs and certain disorders involving the immune mechanism complicating pregnancy, third trimester: Secondary | ICD-10-CM

## 2016-10-03 MED ORDER — HYDROXYPROGESTERONE CAPROATE 250 MG/ML IM OIL
250.0000 mg | TOPICAL_OIL | Freq: Once | INTRAMUSCULAR | Status: AC
  Administered 2016-10-03: 250 mg via INTRAMUSCULAR

## 2016-10-03 MED ORDER — METOCLOPRAMIDE HCL 10 MG PO TABS: 10.0000 mg | ORAL_TABLET | Freq: Three times a day (TID) | ORAL | 1 refills | Status: DC

## 2016-10-03 MED ORDER — PANTOPRAZOLE SODIUM 40 MG PO TBEC: 40.0000 mg | DELAYED_RELEASE_TABLET | Freq: Every day | ORAL | 2 refills | Status: DC

## 2016-10-03 NOTE — Progress Notes (Signed)
NST reactive   PRENATAL VISIT NOTE  Subjective:  Lindsey [name] is a 31 y.o. [id number] at [redacted]w[redacted]d being seen today for ongoing prenatal care.  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  Patient reports heartburn, nausea and mucus discharge.  Contractions: Irregular. Vag. Bleeding: None.  Movement: Present. Denies leaking of fluid.   The following portions of the patient's history were reviewed and updated as appropriate: allergies, current medications, past family history, past medical history, past social history, past surgical history and problem list. Problem list updated.  Objective:   Vitals:   10/03/16 1000  BP: 100/67  Pulse: 81  Weight: 192 lb (87.1 kg)    Fetal Status:   Fundal Height: 32 cm Movement: Present     General:  Alert, oriented and cooperative. Patient is in no acute distress.  Skin: Skin is warm and dry. No rash noted.   Cardiovascular: Normal heart rate noted  Respiratory: Normal respiratory effort, no problems with respiration noted  Abdomen: Soft, gravid, appropriate for gestational age.  Pain/Pressure: Present     Pelvic: Cervical exam deferred        Extremities: Normal range of motion.     Mental Status:  Normal mood and affect. Normal behavior. Normal judgment and thought content.   Assessment and Plan:  Pregnancy: [id number] at [redacted]w[redacted]d [place] f/u tomorrow 1. Supervision of high risk pregnancy, antepartum NST reacitve  2. Current pregnancy with history of pre-term labor in third trimester 17 P weekly  3. Lupus  anticoagulant complicating pregnancy, antepartum (HCC) High risk, IOL 39 weeks   4. Gastroesophageal reflux disease without esophagitis Change her medication - pantoprazole (PROTONIX) 40 MG tablet; Take 1 tablet (40 mg total) by mouth daily.  Dispense: 30 tablet; Refill: 2 - metoCLOPramide (REGLAN) 10 MG tablet; Take 1 tablet (10 mg total) by mouth 4 (four) times daily -  before meals and at bedtime.  Dispense: 120 tablet; Refill: 1  Preterm labor symptoms and general obstetric precautions including but not limited to vaginal bleeding, contractions, leaking of fluid and fetal movement were reviewed in detail with the patient. Please refer to After Visit Summary for other counseling recommendations.  Return in about 1 week (around 10/10/2016) for NST AFI.   Scheryl Darter, MD

## 2016-10-03 NOTE — Progress Notes (Signed)
Pt supply 17p given R upper outer quad w/o difficulty.

## 2016-10-03 NOTE — Progress Notes (Signed)
Pt states that she has lost mucous plug.

## 2016-10-03 NOTE — Patient Instructions (Signed)
Heartburn During Pregnancy Heartburn is pain or discomfort in the throat or chest. It may cause a burning feeling. It happens when stomach acid moves up into the tube that carries food from your mouth to your stomach (esophagus). Heartburn is common during pregnancy. It usually goes away or gets better after giving birth. Follow these instructions at home: Eating and drinking  Do not drink alcohol while you are pregnant.  Figure out which foods and beverages make you feel worse, and avoid them.  Beverages that you may want to avoid include: ? Coffee and tea (with or without caffeine). ? Energy drinks and sports drinks. ? Bubbly (carbonated) drinks or sodas. ? Citrus fruit juices.  Foods that you may want to avoid include: ? Chocolate and cocoa. ? Peppermint and mint flavorings. ? Garlic, onions, and horseradish. ? Spicy and acidic foods. These include peppers, chili powder, curry powder, vinegar, hot sauces, and barbecue sauce. ? Citrus fruits, such as oranges, lemons, and limes. ? Tomato-based foods, such as red sauce, chili, and salsa. ? Fried and fatty foods, such as donuts, french fries, potato chips, and high-fat dressings. ? High-fat meats, such as hot dogs, cold cuts, sausage, ham, and bacon. ? High-fat dairy items, such as whole milk, butter, and cheese.  Eat small meals often, instead of large meals.  Avoid drinking a lot of liquid with your meals.  Avoid eating meals during the 2-3 hours before you go to bed.  Avoid lying down right after you eat.  Do not exercise right after you eat. Medicines  Take over-the-counter and prescription medicines only as told by your doctor.  Do not take aspirin, ibuprofen, or other NSAIDs unless your doctor tells you to do that.  Your doctor may tell you to avoid medicines that have sodium bicarbonate in them. General instructions  If told, raise the head of your bed about 6 inches (15 cm). You can do this by putting blocks under  the legs. Sleeping with more pillows does not help with heartburn.  Do not use any products that contain nicotine or tobacco, such as cigarettes and e-cigarettes. If you need help quitting, ask your doctor.  Wear loose-fitting clothing.  Try to lower your stress, such as with yoga or meditation. If you need help, ask your doctor.  Stay at a healthy weight. If you are overweight, work with your doctor to safely lose weight.  Keep all follow-up visits as told by your doctor. This is important. Contact a doctor if:  You get new symptoms.  Your symptoms do not get better with treatment.  You have weight loss and you do not know why.  You have trouble swallowing.  You make loud sounds when you breathe (wheeze).  You have a cough that does not go away.  You have heartburn often for more than 2 weeks.  You feel sick to your stomach (nauseous), and this does not get better with treatment.  You are throwing up (vomiting), and this does not get better with treatment.  You have pain in your belly (abdomen). Get help right away if:  You have very bad chest pain that spreads to your arm, neck, or jaw.  You feel sweaty, dizzy, or light-headed.  You have trouble breathing.  You have pain when swallowing.  You throw up and your throw-up looks like blood or coffee grounds.  Your poop (stool) is bloody or black. This information is not intended to replace advice given to you by your health care provider.   Make sure you discuss any questions you have with your health care provider. Document Released: 03/04/2010 Document Revised: 10/18/2015 Document Reviewed: 10/18/2015 Elsevier Interactive Patient Education  2017 [employer]  

## 2016-10-04 ENCOUNTER — Ambulatory Visit (HOSPITAL_COMMUNITY)
Admission: RE | Admit: 2016-10-04 | Discharge: 2016-10-04 | Disposition: A | Discharge status: Home / Self Care | Payer: Medicaid Other | Source: Ambulatory Visit | Attending: Certified Nurse Midwife | Admitting: Certified Nurse Midwife

## 2016-10-04 ENCOUNTER — Encounter (HOSPITAL_COMMUNITY): Payer: Self-pay

## 2016-10-04 DIAGNOSIS — M329 Systemic lupus erythematosus, unspecified: Secondary | ICD-10-CM | POA: Insufficient documentation

## 2016-10-04 DIAGNOSIS — O9989 Other specified diseases and conditions complicating pregnancy, childbirth and the puerperium: Secondary | ICD-10-CM | POA: Diagnosis present

## 2016-10-04 DIAGNOSIS — Z3A32 32 weeks gestation of pregnancy: Secondary | ICD-10-CM | POA: Insufficient documentation

## 2016-10-06 ENCOUNTER — Inpatient Hospital Stay (HOSPITAL_COMMUNITY)
Admission: AD | Admit: 2016-10-06 | Discharge: 2016-10-06 | Disposition: A | Discharge status: Home / Self Care | Payer: Medicaid Other | Source: Ambulatory Visit | Attending: Family Medicine | Admitting: Family Medicine

## 2016-10-06 ENCOUNTER — Ambulatory Visit (INDEPENDENT_AMBULATORY_CARE_PROVIDER_SITE_OTHER): Payer: Medicaid Other

## 2016-10-06 VITALS — BP 118/62 | HR 77 | Wt 193.7 lb

## 2016-10-06 DIAGNOSIS — O99113 Other diseases of the blood and blood-forming organs and certain disorders involving the immune mechanism complicating pregnancy, third trimester: Secondary | ICD-10-CM

## 2016-10-06 DIAGNOSIS — O9989 Other specified diseases and conditions complicating pregnancy, childbirth and the puerperium: Secondary | ICD-10-CM | POA: Insufficient documentation

## 2016-10-06 DIAGNOSIS — Z3A32 32 weeks gestation of pregnancy: Secondary | ICD-10-CM | POA: Insufficient documentation

## 2016-10-06 DIAGNOSIS — D6862 Lupus anticoagulant syndrome: Secondary | ICD-10-CM

## 2016-10-06 DIAGNOSIS — R102 Pelvic and perineal pain: Secondary | ICD-10-CM | POA: Insufficient documentation

## 2016-10-06 DIAGNOSIS — J45909 Unspecified asthma, uncomplicated: Secondary | ICD-10-CM | POA: Insufficient documentation

## 2016-10-06 DIAGNOSIS — Z7952 Long term (current) use of systemic steroids: Secondary | ICD-10-CM | POA: Diagnosis not present

## 2016-10-06 DIAGNOSIS — O479 False labor, unspecified: Secondary | ICD-10-CM

## 2016-10-06 DIAGNOSIS — O99513 Diseases of the respiratory system complicating pregnancy, third trimester: Secondary | ICD-10-CM | POA: Diagnosis not present

## 2016-10-06 DIAGNOSIS — M329 Systemic lupus erythematosus, unspecified: Secondary | ICD-10-CM | POA: Diagnosis not present

## 2016-10-06 DIAGNOSIS — Z87891 Personal history of nicotine dependence: Secondary | ICD-10-CM | POA: Insufficient documentation

## 2016-10-06 DIAGNOSIS — Z79899 Other long term (current) drug therapy: Secondary | ICD-10-CM | POA: Insufficient documentation

## 2016-10-06 DIAGNOSIS — O99119 Other diseases of the blood and blood-forming organs and certain disorders involving the immune mechanism complicating pregnancy, unspecified trimester: Secondary | ICD-10-CM

## 2016-10-06 DIAGNOSIS — O26893 Other specified pregnancy related conditions, third trimester: Secondary | ICD-10-CM | POA: Diagnosis not present

## 2016-10-06 LAB — URINALYSIS, ROUTINE W REFLEX MICROSCOPIC
BILIRUBIN URINE: NEGATIVE
Glucose, UA: NEGATIVE mg/dL
HGB URINE DIPSTICK: NEGATIVE
KETONES UR: NEGATIVE mg/dL
Leukocytes, UA: NEGATIVE
NITRITE: NEGATIVE
PH: 6 (ref 5.0–8.0)
Protein, ur: NEGATIVE mg/dL
Specific Gravity, Urine: 1.016 (ref 1.005–1.030)

## 2016-10-06 NOTE — Discharge Instructions (Signed)
Keep all prenatal visits   Preterm Labor and Birth Information Pregnancy normally lasts 39-41 weeks. Preterm labor is when labor starts early. It starts before you have been pregnant for 37 whole weeks. What are the risk factors for preterm labor? Preterm labor is more likely to occur in women who:  Have an infection while pregnant.  Have a cervix that is short.  Have gone into preterm labor before.  Have had surgery on their cervix.  Are younger than age 71.  Are older than age 11.  Are African American.  Are pregnant with two or more babies.  Take street drugs while pregnant.  Smoke while pregnant.  Do not gain enough weight while pregnant.  Got pregnant right after another pregnancy.  What are the symptoms of preterm labor? Symptoms of preterm labor include:  Cramps. The cramps may feel like the cramps some women get during their period. The cramps may happen with watery poop (diarrhea).  Pain in the belly (abdomen).  Pain in the lower back.  Regular contractions or tightening. It may feel like your belly is getting tighter.  Pressure in the lower belly that seems to get stronger.  More fluid (discharge) leaking from the vagina. The fluid may be watery or bloody.  Water breaking.  Why is it important to notice signs of preterm labor? Babies who are born early may not be fully developed. They have a higher chance for:  Long-term heart problems.  Long-term lung problems.  Trouble controlling body systems, like breathing.  Bleeding in the brain.  A condition called cerebral palsy.  Learning difficulties.  Death.  These risks are highest for babies who are born before 34 weeks of pregnancy. How is preterm labor treated? Treatment depends on:  How long you were pregnant.  Your condition.  The health of your baby.  Treatment may involve:  Having a stitch (suture) placed in your cervix. When you give birth, your cervix opens so the baby can  come out. The stitch keeps the cervix from opening too soon.  Staying at the hospital.  Taking or getting medicines, such as: ? Hormone medicines. ? Medicines to stop contractions. ? Medicines to help the babys lungs develop. ? Medicines to prevent your baby from having cerebral palsy.  What should I do if I am in preterm labor? If you think you are going into labor too soon, call your doctor right away. How can I prevent preterm labor?  Do not use any tobacco products. ? Examples of these are cigarettes, chewing tobacco, and e-cigarettes. ? If you need help quitting, ask your doctor.  Do not use street drugs.  Do not use any medicines unless you ask your doctor if they are safe for you.  Talk with your doctor before taking any herbal supplements.  Make sure you gain enough weight.  Watch for infection. If you think you might have an infection, get it checked right away.  If you have gone into preterm labor before, tell your doctor. This information is not intended to replace advice given to you by your health care provider. Make sure you discuss any questions you have with your health care provider. Document Released: 04/28/2008 Document Revised: 07/13/2015 Document Reviewed: 06/23/2015 Elsevier Interactive Patient Education  2018 [employer].

## 2016-10-06 NOTE — MAU Note (Signed)
Pt reports she is having severe pelvic pain since yesterday, was in office today for an NST and was having contractions so they sent her here. Denies dysuria, bleeding or ROM

## 2016-10-06 NOTE — Progress Notes (Signed)
Nurse visit for NST dx: Lupus. Reviewed NST with MD NST R; ctx's every 3-5 mins. D/t to preterm delivery hx, pt to MAU for further evaluation for ctx's.

## 2016-10-06 NOTE — MAU Provider Note (Signed)
History  Chief Complaint:  Contractions and Pelvic Pain  Lindsey [name] is a 31 y.o. [id number] female at [redacted]w[redacted]d presenting from clinic with contractions reported every 3-5 minutes. However patient states she does not feel any contractions. She was being seen for NST in clinic. She had a reactive NST in clinic today. The contractions have not changed from her baseline. She reports significant pelvic pain and pressure. She states that when she stands up "everything drops" and she gets a sharp pain. She has previously been diagnosed with round ligament pain and states that this feels similar. She states that she thinks she partially lost her mucus plug. She reports occasional mild nausea. She denies fevers, HA, blurry vision, RUQ pain, diarrhea, lower leg swelling/pain.    Reports active fetal movement, vaginal bleeding: none, membranes: intact. Denies uti s/s, abnormal/malodorous vag d/c or vulvovaginal itching/irritation.    Prenatal care at [facility]. Pregnancy complicated by +lupus anticoagulant, Hx of preterm labor.  Obstetrical History: OB History    Gravida Para Term Preterm AB Living   4 2 1 1 1 2    SAB TAB Ectopic Multiple Live Births   0 1     2      Past Medical History: Past Medical History:  Diagnosis Date  . Anemia   . Asthma   . Medical history non-contributory   . Systemic lupus erythematosus (HCC)     Past Surgical History: Past Surgical History:  Procedure Laterality Date  . CHOLECYSTECTOMY    . TOOTH EXTRACTION      Social History: Social History   Social History  . Marital status: Single    Spouse name: N/A  . Number of children: N/A  . Years of education: N/A   Social History Main Topics  . Smoking status: Former Smoker    Quit date: 04/18/2011  . Smokeless tobacco: Never Used  . Alcohol use No  . Drug use: No  . Sexual activity: Yes   Other Topics Concern  . Not on file   Social History Narrative  . No narrative on file    Allergies: Allergies   Allergen Reactions  . Bee Venom Anaphylaxis  . Pineapple Shortness Of Breath and Itching  . Codeine Nausea And Vomiting    Facility-Administered Medications Prior to Admission  Medication Dose Route Frequency Provider Last Rate Last Dose  . hydroxyprogesterone caproate (MAKENA) 250 mg/mL injection 250 mg  250 mg Intramuscular Weekly Adam Phenix, MD   250 mg at 09/21/16 1437  . hydroxyprogesterone caproate (MAKENA) 250 mg/mL injection 250 mg  250 mg Intramuscular Once Hermina Staggers, MD      . hydroxyprogesterone caproate (MAKENA) 250 mg/mL injection 250 mg  250 mg Intramuscular Once Adam Phenix, MD      . hydroxyprogesterone caproate (MAKENA) 250 mg/mL injection 250 mg  250 mg Intramuscular Once Hermina Staggers, MD       Prescriptions Prior to Admission  Medication Sig Dispense Refill Last Dose  . acetaminophen (TYLENOL) 325 MG tablet Take 650 mg by mouth every 6 (six) hours as needed for moderate pain.   Taking  . Doxylamine-Pyridoxine (DICLEGIS) 10-10 MG TBEC Take 1 tablet with breakfast and lunch.  Take 2 tablets at bedtime. 100 tablet 4 Taking  . Elastic Bandages & Supports (COMFORT FIT MATERNITY SUPP MED) MISC Wear daily when ambulating 1 each 0 Taking  . ferrous sulfate (FERROUSUL) 325 (65 FE) MG tablet Take 1 tablet (325 mg total) by mouth 2 (two) times daily.  60 tablet 1 Taking  . glycopyrrolate (ROBINUL) 2 MG tablet Take 1 tablet (2 mg total) by mouth 3 (three) times daily as needed. 30 tablet 3 Taking  . hydroxychloroquine (PLAQUENIL) 200 MG tablet Take 1 tablet (200 mg total) by mouth daily. 30 tablet 6 Taking  . metoCLOPramide (REGLAN) 10 MG tablet Take 1 tablet (10 mg total) by mouth 4 (four) times daily -  before meals and at bedtime. 120 tablet 1 Taking  . NIFEdipine (PROCARDIA) 10 MG capsule Take 1 capsule (10 mg total) by mouth every 4 (four) hours as needed. 30 capsule 2 Taking  . pantoprazole (PROTONIX) 40 MG tablet Take 1 tablet (40 mg total) by mouth daily.  (Patient not taking: Reported on 10/04/2016) 30 tablet 2 Not Taking  . predniSONE (DELTASONE) 10 MG tablet Take 1 tablet (10 mg total) by mouth daily with breakfast. 30 tablet 2 Taking  . Prenat-FeAsp-Meth-FA-DHA w/o A (PRENATE PIXIE) 10-0.6-0.4-200 MG CAPS Take 1 tablet by mouth daily. 30 capsule 12 Taking  . promethazine (PHENERGAN) 25 MG tablet Take 1 tablet (25 mg total) by mouth every 6 (six) hours as needed for nausea or vomiting. 30 tablet 0 Taking  . Vitamin D, Ergocalciferol, (DRISDOL) 50000 units CAPS capsule Take 1 capsule (50,000 Units total) by mouth every 7 (seven) days. 30 capsule 2 Taking    Review of Systems  Pertinent pos/neg as indicated in HPI  Physical Exam  Blood pressure 117/66, pulse 76, temperature 98.7 F (37.1 C), temperature source Oral, resp. rate 15, height 5\' 4"  (1.626 m), weight 84.8 kg (187 lb), last menstrual period 02/21/2016, SpO2 100 %. General appearance: alert, cooperative and no distress Lungs: normal work of breathing Heart: RRR, pulses intact Abdomen: gravid, soft, non-tender Extremities: no edema  Dilation: Closed Effacement (%): Thick Cervical Position: Posterior Station: Ballotable Exam by:: Dr. Doroteo Glassman Presentation: cephalic per 8/22 US  Fetal monitoring: FHR: 145 bpm, variability: moderate,  Accelerations: Present,  decelerations:  Absent Uterine activity: irritability   MAU Course  MDM FWB monitoring in MAU for 1h SVE History and physical exam Patient verbalized agreement and understanding with the assessment and plan.  Labs:  Results for orders placed or performed during the hospital encounter of 10/06/16 (from the past 24 hour(s))  Urinalysis, Routine w reflex microscopic     Status: None   Collection Time: 10/06/16  1:33 PM  Result Value Ref Range   Color, Urine YELLOW YELLOW   APPearance CLEAR CLEAR   Specific Gravity, Urine 1.016 1.005 - 1.030   pH 6.0 5.0 - 8.0   Glucose, UA NEGATIVE NEGATIVE mg/dL   Hgb urine dipstick  NEGATIVE NEGATIVE   Bilirubin Urine NEGATIVE NEGATIVE   Ketones, ur NEGATIVE NEGATIVE mg/dL   Protein, ur NEGATIVE NEGATIVE mg/dL   Nitrite NEGATIVE NEGATIVE   Leukocytes, UA NEGATIVE NEGATIVE    Imaging:  None Assessment and Plan  A: Lindsey [name] is a 31yo [id number] at [redacted]w[redacted]d who presents from clinic for evaluation of contractions. Has history of preterm contractions. Given reactive NST in clinic, SVE and irregular ctx seen on toco, preterm labor and fetal distress are unlikely. Contractions are likely braxton-hicks vs irritability.  Cat 1 FHR  P:  Pt deemed appropriate for discharge. Patient advised to continue using belly band for round ligament pain. Return precautions given regarding new or worsening symptoms. Labor precautions given. Patient has Procardia at home and was instructed about when to use it. Keep next appt on Monday for supervision of high risk pregnancy.  Dannette Barbara, Medical Student 8/24/20183:42 PM  OB FELLOW MAU DISCHARGE ATTESTATION  I have seen and examined this patient. I agree with above documentation in medical student's note. Plan was made together. I have made edits as needed.   Caryl Ada, DO OB Fellow 7:10 PM

## 2016-10-10 ENCOUNTER — Encounter: Payer: Medicaid Other | Admitting: Obstetrics and Gynecology

## 2016-10-10 ENCOUNTER — Other Ambulatory Visit: Payer: Medicaid Other

## 2016-10-13 ENCOUNTER — Other Ambulatory Visit: Payer: Medicaid Other

## 2016-10-17 ENCOUNTER — Other Ambulatory Visit: Payer: Medicaid Other

## 2016-10-17 ENCOUNTER — Encounter: Payer: Medicaid Other | Admitting: Obstetrics & Gynecology

## 2016-10-17 ENCOUNTER — Ambulatory Visit (INDEPENDENT_AMBULATORY_CARE_PROVIDER_SITE_OTHER): Payer: Medicaid Other | Admitting: Obstetrics & Gynecology

## 2016-10-17 VITALS — BP 109/62 | HR 88 | Wt 194.7 lb

## 2016-10-17 DIAGNOSIS — D6862 Lupus anticoagulant syndrome: Secondary | ICD-10-CM

## 2016-10-17 DIAGNOSIS — O99119 Other diseases of the blood and blood-forming organs and certain disorders involving the immune mechanism complicating pregnancy, unspecified trimester: Secondary | ICD-10-CM

## 2016-10-17 DIAGNOSIS — O0993 Supervision of high risk pregnancy, unspecified, third trimester: Secondary | ICD-10-CM

## 2016-10-17 DIAGNOSIS — O99113 Other diseases of the blood and blood-forming organs and certain disorders involving the immune mechanism complicating pregnancy, third trimester: Secondary | ICD-10-CM

## 2016-10-17 DIAGNOSIS — O0990 Supervision of high risk pregnancy, unspecified, unspecified trimester: Secondary | ICD-10-CM

## 2016-10-17 NOTE — Progress Notes (Signed)
Normal growth on US 8/22. NST reactive and AFI nl today   PRENATAL VISIT NOTE  Subjective:  Lindsey [name] is a 31 y.o. [id number] at 3646w1d being seen today for ongoing prenatal care.  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  Patient reports nausea and itching, spittng, reflux.  Contractions: Irregular. Vag. Bleeding: None.  Movement: Present. Denies leaking of fluid.   The following portions of the patient's history were reviewed and updated as appropriate: allergies, current medications, past family history, past medical history, past social history, past surgical history and problem list. Problem list updated.  Objective:   Vitals:   10/17/16 0946  BP: 109/62  Pulse: 88  Weight: 194 lb 11.2 oz (88.3 kg)    Fetal Status: Fetal Heart Rate (bpm): NST/AFI   Movement: Present  Presentation: Vertex  General:  Alert, oriented and cooperative. Patient is in no acute distress.  Skin: Skin is warm and dry. No rash noted.   Cardiovascular: Normal heart rate noted  Respiratory: Normal respiratory effort, no problems with respiration noted  Abdomen: Soft, gravid, appropriate for gestational age.  Pain/Pressure: Present     Pelvic: Cervical exam deferred        Extremities: Normal range of motion.  Edema: None  Mental Status:  Normal mood and affect. Normal behavior. Normal judgment and thought content.   Assessment and Plan:  Pregnancy: [id number] at 5546w1d  1. Supervision of high risk pregnancy, antepartum Possible cholestasis - Bile acids, total  2.  Lupus anticoagulant complicating pregnancy, antepartum (HCC) Fetal surveillance normal - US OB Limited; Future - Fetal nonstress test  Preterm labor symptoms and general obstetric precautions including but not limited to vaginal bleeding, contractions, leaking of fluid and fetal movement were reviewed in detail with the patient. Please refer to After Visit Summary for other counseling recommendations.  Return in about 1 week (around 10/24/2016) for NST BPP. Declines 17 P  Scheryl DarterJames Rena Hunke, MD

## 2016-10-17 NOTE — Progress Notes (Signed)
Pt c/o entire body itching and no longer wants 17p because it makes her feel "horrible."

## 2016-10-17 NOTE — Patient Instructions (Signed)
Cholestasis of Pregnancy Cholestasis refers to any condition that causes the flow of digestive fluid (bile) produced by the liver to slow down or stop. Cholestasis of pregnancy is most common toward the end of pregnancy (thirdtrimester), but it can occur any time during pregnancy. The condition often goes away soon after giving birth. Cholestasis may be uncomfortable, but it is usually harmless to you. However, it can be harmful to your baby. Cholestasis may increase the risk of:  Your baby being born too early (preterm delivery).  Your baby having a slow heart rate and lack of oxygen during delivery (fetal distress).  Losing your baby before delivery (stillbirth).  What are the causes? The exact cause of this condition is not known, but it may be related to:  Pregnancy hormones. The gallbladder normally holds bile until you need it to help digest fat in your diet. Pregnancy hormones may cause the flow of bile to slow down and back up into your liver. Bile may then get into your bloodstream and cause cholestasis symptoms.  Changes in your genes (genetic mutations). Specifically, genes that affect how the liver releases bile.  What increases the risk? You are more likely to develop this condition if:  You had cholestasis during a previous pregnancy.  You have a family history of cholestasis.  You have liver problems.  You are having multiple babies, such as twins or triplets.  What are the signs or symptoms? The most common symptom of this condition is intense itching (pruritus), especially on the palms of your hands and soles of your feet. The itching can spread to the rest of your body and is often worse at night. You will not usually have a rash. Other symptoms may include:  Feeling tired.  Pain in your upper right abdomen.  Dark-colored urine.  Light-colored stools.  Poor appetite.  Yellowish discoloration of your skin and the whites of your eyes (jaundice).  How is this  diagnosed? This condition is diagnosed based on:  Your medical history.  A physical exam.  Blood tests.  If you have an inherited risk for developing this condition, you may also have genetic testing. How is this treated? The goal of treatment is to make you comfortable and keep your baby safe. Your health care provider may:  Prescribe medicine to reduce bile acid in your bloodstream, relieve symptoms, and help keep your baby safe.  Give you vitamin K before delivery to prevent excessive bleeding.  Check your baby frequently (fetal monitoring).  Perform regular blood tests to check your bile levels and liver function until your baby is delivered.  Recommend starting (inducing) your labor and delivery by week 36 or 37 of pregnancy, or as soon as your baby's lungs have developed enough.  Follow these instructions at home:  Take over-the-counter and prescription medicines only as told by your health care provider.  Take cool baths to soothe itchy skin.  Wear comfortable, loose-fitting, cotton clothing to reduce itching.  Keep your fingernails short to prevent skin irritation from scratching.  Keep all follow-up visits and prenatal visits as told by your health care provider. This is important. Contact a health care provider if:  Your symptoms get worse, even with treatment.  You develop pain in your right side.  You have unusual swelling in your abdomen, feet, ankles, or legs.  You have a fever.  You are more thirsty than usual. Get help right away if:  You go into early labor.  You have a headache that   does not go away or causes changes in vision.  You have nausea or you vomit.  You have severe pain in your abdomen or shoulders.  You have shortness of breath. Summary  Cholestasis of pregnancy is most common toward the end of pregnancy (thirdtrimester), but it can occur any time during your pregnancy.  The condition often goes away soon after your baby is  born.  The most common symptom of cholestasis of pregnancy is intense itching (pruritus), especially on the palms of your hands and soles of your feet.  This condition may be treated with medicine, frequent monitoring, or starting (inducing) labor and delivery by week 36 or 37 of pregnancy. This information is not intended to replace advice given to you by your health care provider. Make sure you discuss any questions you have with your health care provider. Document Released: 01/28/2000 Document Revised: 01/15/2016 Document Reviewed: 01/15/2016 Elsevier Interactive Patient Education  2017 [employer]  

## 2016-10-18 LAB — BILE ACIDS, TOTAL: Bile Acids Total: 7 umol/L (ref 4.7–24.5)

## 2016-10-20 ENCOUNTER — Other Ambulatory Visit: Payer: Medicaid Other

## 2016-10-24 ENCOUNTER — Other Ambulatory Visit: Payer: Medicaid Other

## 2016-10-24 ENCOUNTER — Encounter: Payer: Self-pay | Admitting: Family Medicine

## 2016-10-24 ENCOUNTER — Ambulatory Visit (INDEPENDENT_AMBULATORY_CARE_PROVIDER_SITE_OTHER): Payer: Medicaid Other | Admitting: Family Medicine

## 2016-10-24 VITALS — BP 109/58 | HR 86 | Wt 196.4 lb

## 2016-10-24 DIAGNOSIS — D6862 Lupus anticoagulant syndrome: Secondary | ICD-10-CM

## 2016-10-24 DIAGNOSIS — O09213 Supervision of pregnancy with history of pre-term labor, third trimester: Secondary | ICD-10-CM | POA: Diagnosis not present

## 2016-10-24 DIAGNOSIS — O9989 Other specified diseases and conditions complicating pregnancy, childbirth and the puerperium: Secondary | ICD-10-CM

## 2016-10-24 DIAGNOSIS — O0990 Supervision of high risk pregnancy, unspecified, unspecified trimester: Secondary | ICD-10-CM

## 2016-10-24 DIAGNOSIS — M329 Systemic lupus erythematosus, unspecified: Secondary | ICD-10-CM

## 2016-10-24 DIAGNOSIS — Z8739 Personal history of other diseases of the musculoskeletal system and connective tissue: Secondary | ICD-10-CM

## 2016-10-24 DIAGNOSIS — O99891 Other specified diseases and conditions complicating pregnancy: Secondary | ICD-10-CM

## 2016-10-24 DIAGNOSIS — IMO0002 Reserved for concepts with insufficient information to code with codable children: Secondary | ICD-10-CM

## 2016-10-24 DIAGNOSIS — O99119 Other diseases of the blood and blood-forming organs and certain disorders involving the immune mechanism complicating pregnancy, unspecified trimester: Secondary | ICD-10-CM

## 2016-10-24 DIAGNOSIS — O99113 Other diseases of the blood and blood-forming organs and certain disorders involving the immune mechanism complicating pregnancy, third trimester: Secondary | ICD-10-CM

## 2016-10-24 DIAGNOSIS — O0993 Supervision of high risk pregnancy, unspecified, third trimester: Secondary | ICD-10-CM

## 2016-10-24 NOTE — Progress Notes (Signed)
   PRENATAL VISIT NOTE  Subjective:  Lindsey [name] is a 31 y.o. [id number] at 6137w1d being seen today for ongoing prenatal care.  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  Patient reports contractions since this weekend, not taking previously prescribed Procardia.  Contractions: Irregular. Vag. Bleeding: None.  Movement: Present. Denies leaking of fluid.   The following portions of the patient's history were reviewed and updated as appropriate: allergies, current medications, past family history, past medical history, past social history, past surgical history and problem list. Problem list updated.  Objective:   Vitals:   10/24/16 0926  BP: (!) 109/58  Pulse: 86  Weight: 196 lb 6.4 oz (89.1 kg)    Fetal Status: Fetal Heart Rate (bpm): NST/AFI   Movement: Present  Presentation: Vertex  General:  Alert, oriented and cooperative. Patient is in no acute distress.  Skin: Skin is warm and dry. No rash noted.   Cardiovascular: Normal heart rate noted  Respiratory: Normal respiratory effort, no problems with respiration noted  Abdomen: Soft, gravid, appropriate for gestational age.  Pain/Pressure: Present     Pelvic: Cervical exam performed Dilation: 1 Effacement (%): Thick Station: -3  Extremities: Normal range of motion.  Edema: None  Mental Status:  Normal mood and affect. Normal behavior. Normal judgment and thought content.  NST:  Baseline: 145 bpm, Variability: Good {> 6 bpm), Accelerations: Reactive and Decelerations: Absent AFI  -12.7  Assessment and Plan:  Pregnancy: [id number] at 6737w1d  1. Supervision of high risk pregnancy, antepartum Continue prenatal care. GBS culture next week   2. Lupus complicating pregnancy, antepartum (HCC) Normal growth 8/22--f/u scheduled - Fetal nonstress test - US OB Limited; Future - US MFM OB FOLLOW UP; Future  3. History of lupus No symptoms or flares since June 2018 Continue Plaquenil Continue ASA  4. Current pregnancy with history of pre-term labor in third trimester Continue 17 P Procardia prn Labor precuations given  Preterm labor symptoms and general obstetric precautions including but not limited to vaginal bleeding, contractions, leaking of fluid and fetal movement were reviewed in detail with the patient. Please refer to After Visit Summary for other counseling recommendations.  Return in 1 week (on 10/31/2016).   Reva Boresanya S Pratt, MD

## 2016-10-24 NOTE — Patient Instructions (Signed)
 Third Trimester of Pregnancy The third trimester is from week 28 through week 40 (months 7 through 9). The third trimester is a time when the unborn baby (fetus) is growing rapidly. At the end of the ninth month, the fetus is about 20 inches in length and weighs 6-10 pounds. Body changes during your third trimester Your body will continue to go through many changes during pregnancy. The changes vary from woman to woman. During the third trimester:  Your weight will continue to increase. You can expect to gain 25-35 pounds (11-16 kg) by the end of the pregnancy.  You may begin to get stretch marks on your hips, abdomen, and breasts.  You may urinate more often because the fetus is moving lower into your pelvis and pressing on your bladder.  You may develop or continue to have heartburn. This is caused by increased hormones that slow down muscles in the digestive tract.  You may develop or continue to have constipation because increased hormones slow digestion and cause the muscles that push waste through your intestines to relax.  You may develop hemorrhoids. These are swollen veins (varicose veins) in the rectum that can itch or be painful.  You may develop swollen, bulging veins (varicose veins) in your legs.  You may have increased body aches in the pelvis, back, or thighs. This is due to weight gain and increased hormones that are relaxing your joints.  You may have changes in your hair. These can include thickening of your hair, rapid growth, and changes in texture. Some women also have hair loss during or after pregnancy, or hair that feels dry or thin. Your hair will most likely return to normal after your baby is born.  Your breasts will continue to grow and they will continue to become tender. A yellow fluid (colostrum) may leak from your breasts. This is the first milk you are producing for your baby.  Your belly button may stick out.  You may notice more swelling in your  hands, face, or ankles.  You may have increased tingling or numbness in your hands, arms, and legs. The skin on your belly may also feel numb.  You may feel short of breath because of your expanding uterus.  You may have more problems sleeping. This can be caused by the size of your belly, increased need to urinate, and an increase in your body's metabolism.  You may notice the fetus "dropping," or moving lower in your abdomen (lightening).  You may have increased vaginal discharge.  You may notice your joints feel loose and you may have pain around your pelvic bone.  What to expect at prenatal visits You will have prenatal exams every 2 weeks until week 36. Then you will have weekly prenatal exams. During a routine prenatal visit:  You will be weighed to make sure you and the baby are growing normally.  Your blood pressure will be taken.  Your abdomen will be measured to track your baby's growth.  The fetal heartbeat will be listened to.  Any test results from the previous visit will be discussed.  You may have a cervical check near your due date to see if your cervix has softened or thinned (effaced).  You will be tested for Group B streptococcus. This happens between 35 and 37 weeks.  Your health care provider may ask you:  What your birth plan is.  How you are feeling.  If you are feeling the baby move.  If you have   had any abnormal symptoms, such as leaking fluid, bleeding, severe headaches, or abdominal cramping.  If you are using any tobacco products, including cigarettes, chewing tobacco, and electronic cigarettes.  If you have any questions.  Other tests or screenings that may be performed during your third trimester include:  Blood tests that check for low iron levels (anemia).  Fetal testing to check the health, activity level, and growth of the fetus. Testing is done if you have certain medical conditions or if there are problems during the  pregnancy.  Nonstress test (NST). This test checks the health of your baby to make sure there are no signs of problems, such as the baby not getting enough oxygen. During this test, a belt is placed around your belly. The baby is made to move, and its heart rate is monitored during movement.  What is false labor? False labor is a condition in which you feel small, irregular tightenings of the muscles in the womb (contractions) that usually go away with rest, changing position, or drinking water. These are called Braxton Hicks contractions. Contractions may last for hours, days, or even weeks before true labor sets in. If contractions come at regular intervals, become more frequent, increase in intensity, or become painful, you should see your health care provider. What are the signs of labor?  Abdominal cramps.  Regular contractions that start at 10 minutes apart and become stronger and more frequent with time.  Contractions that start on the top of the uterus and spread down to the lower abdomen and back.  Increased pelvic pressure and dull back pain.  A watery or bloody mucus discharge that comes from the vagina.  Leaking of amniotic fluid. This is also known as your "water breaking." It could be a slow trickle or a gush. Let your health care provider know if it has a color or strange odor. If you have any of these signs, call your health care provider right away, even if it is before your due date. Follow these instructions at home: Medicines  Follow your health care provider's instructions regarding medicine use. Specific medicines may be either safe or unsafe to take during pregnancy.  Take a prenatal vitamin that contains at least 600 micrograms (mcg) of folic acid.  If you develop constipation, try taking a stool softener if your health care provider approves. Eating and drinking  Eat a balanced diet that includes fresh fruits and vegetables, whole grains, good sources of protein  such as meat, eggs, or tofu, and low-fat dairy. Your health care provider will help you determine the amount of weight gain that is right for you.  Avoid raw meat and uncooked cheese. These carry germs that can cause birth defects in the baby.  If you have low calcium intake from food, talk to your health care provider about whether you should take a daily calcium supplement.  Eat four or five small meals rather than three large meals a day.  Limit foods that are high in fat and processed sugars, such as fried and sweet foods.  To prevent constipation: ? Drink enough fluid to keep your urine clear or pale yellow. ? Eat foods that are high in fiber, such as fresh fruits and vegetables, whole grains, and beans. Activity  Exercise only as directed by your health care provider. Most women can continue their usual exercise routine during pregnancy. Try to exercise for 30 minutes at least 5 days a week. Stop exercising if you experience uterine contractions.  Avoid   heavy lifting.  Do not exercise in extreme heat or humidity, or at high altitudes.  Wear low-heel, comfortable shoes.  Practice good posture.  You may continue to have sex unless your health care provider tells you otherwise. Relieving pain and discomfort  Take frequent breaks and rest with your legs elevated if you have leg cramps or low back pain.  Take warm sitz baths to soothe any pain or discomfort caused by hemorrhoids. Use hemorrhoid cream if your health care provider approves.  Wear a good support bra to prevent discomfort from breast tenderness.  If you develop varicose veins: ? Wear support pantyhose or compression stockings as told by your healthcare provider. ? Elevate your feet for 15 minutes, 3-4 times a day. Prenatal care  Write down your questions. Take them to your prenatal visits.  Keep all your prenatal visits as told by your health care provider. This is important. Safety  Wear your seat belt at  all times when driving.  Make a list of emergency phone numbers, including numbers for family, friends, the hospital, and police and fire departments. General instructions  Avoid cat litter boxes and soil used by cats. These carry germs that can cause birth defects in the baby. If you have a cat, ask someone to clean the litter box for you.  Do not travel far distances unless it is absolutely necessary and only with the approval of your health care provider.  Do not use hot tubs, steam rooms, or saunas.  Do not drink alcohol.  Do not use any products that contain nicotine or tobacco, such as cigarettes and e-cigarettes. If you need help quitting, ask your health care provider.  Do not use any medicinal herbs or unprescribed drugs. These chemicals affect the formation and growth of the baby.  Do not douche or use tampons or scented sanitary pads.  Do not cross your legs for long periods of time.  To prepare for the arrival of your baby: ? Take prenatal classes to understand, practice, and ask questions about labor and delivery. ? Make a trial run to the hospital. ? Visit the hospital and tour the maternity area. ? Arrange for maternity or paternity leave through employers. ? Arrange for family and friends to take care of pets while you are in the hospital. ? Purchase a rear-facing car seat and make sure you know how to install it in your car. ? Pack your hospital bag. ? Prepare the baby's nursery. Make sure to remove all pillows and stuffed animals from the baby's crib to prevent suffocation.  Visit your dentist if you have not gone during your pregnancy. Use a soft toothbrush to brush your teeth and be gentle when you floss. Contact a health care provider if:  You are unsure if you are in labor or if your water has broken.  You become dizzy.  You have mild pelvic cramps, pelvic pressure, or nagging pain in your abdominal area.  You have lower back pain.  You have persistent  nausea, vomiting, or diarrhea.  You have an unusual or bad smelling vaginal discharge.  You have pain when you urinate. Get help right away if:  Your water breaks before 37 weeks.  You have regular contractions less than 5 minutes apart before 37 weeks.  You have a fever.  You are leaking fluid from your vagina.  You have spotting or bleeding from your vagina.  You have severe abdominal pain or cramping.  You have rapid weight loss or weight   gain.  You have shortness of breath with chest pain.  You notice sudden or extreme swelling of your face, hands, ankles, feet, or legs.  Your baby makes fewer than 10 movements in 2 hours.  You have severe headaches that do not go away when you take medicine.  You have vision changes. Summary  The third trimester is from week 28 through week 40, months 7 through 9. The third trimester is a time when the unborn baby (fetus) is growing rapidly.  During the third trimester, your discomfort may increase as you and your baby continue to gain weight. You may have abdominal, leg, and back pain, sleeping problems, and an increased need to urinate.  During the third trimester your breasts will keep growing and they will continue to become tender. A yellow fluid (colostrum) may leak from your breasts. This is the first milk you are producing for your baby.  False labor is a condition in which you feel small, irregular tightenings of the muscles in the womb (contractions) that eventually go away. These are called Braxton Hicks contractions. Contractions may last for hours, days, or even weeks before true labor sets in.  Signs of labor can include: abdominal cramps; regular contractions that start at 10 minutes apart and become stronger and more frequent with time; watery or bloody mucus discharge that comes from the vagina; increased pelvic pressure and dull back pain; and leaking of amniotic fluid. This information is not intended to replace advice  given to you by your health care provider. Make sure you discuss any questions you have with your health care provider. Document Released: 01/24/2001 Document Revised: 07/08/2015 Document Reviewed: 04/02/2012 [employer] Interactive Patient Education  2017 [employer]   Breastfeeding Deciding to breastfeed is one of the best choices you can make for you and your baby. A change in hormones during pregnancy causes your breast tissue to grow and increases the number and size of your milk ducts. These hormones also allow proteins, sugars, and fats from your blood supply to make breast milk in your milk-producing glands. Hormones prevent breast milk from being released before your baby is born as well as prompt milk flow after birth. Once breastfeeding has begun, thoughts of your baby, as well as his or her sucking or crying, can stimulate the release of milk from your milk-producing glands. Benefits of breastfeeding For Your Baby  Your first milk (colostrum) helps your baby's digestive system function better.  There are antibodies in your milk that help your baby fight off infections.  Your baby has a lower incidence of asthma, allergies, and sudden infant death syndrome.  The nutrients in breast milk are better for your baby than infant formulas and are designed uniquely for your baby's needs.  Breast milk improves your baby's brain development.  Your baby is less likely to develop other conditions, such as childhood obesity, asthma, or type 2 diabetes mellitus.  For You  Breastfeeding helps to create a very special bond between you and your baby.  Breastfeeding is convenient. Breast milk is always available at the correct temperature and costs nothing.  Breastfeeding helps to burn calories and helps you lose the weight gained during pregnancy.  Breastfeeding makes your uterus contract to its prepregnancy size faster and slows bleeding (lochia) after you give birth.  Breastfeeding helps  to lower your risk of developing type 2 diabetes mellitus, osteoporosis, and breast or ovarian cancer later in life.  Signs that your baby is hungry Early Signs of Hunger    Increased alertness or activity.  Stretching.  Movement of the head from side to side.  Movement of the head and opening of the mouth when the corner of the mouth or cheek is stroked (rooting).  Increased sucking sounds, smacking lips, cooing, sighing, or squeaking.  Hand-to-mouth movements.  Increased sucking of fingers or hands.  Late Signs of Hunger  Fussing.  Intermittent crying.  Extreme Signs of Hunger Signs of extreme hunger will require calming and consoling before your baby will be able to breastfeed successfully. Do not wait for the following signs of extreme hunger to occur before you initiate breastfeeding:  Restlessness.  A loud, strong cry.  Screaming.  Breastfeeding basics Breastfeeding Initiation  Find a comfortable place to sit or lie down, with your neck and back well supported.  Place a pillow or rolled up blanket under your baby to bring him or her to the level of your breast (if you are seated). Nursing pillows are specially designed to help support your arms and your baby while you breastfeed.  Make sure that your baby's abdomen is facing your abdomen.  Gently massage your breast. With your fingertips, massage from your chest wall toward your nipple in a circular motion. This encourages milk flow. You may need to continue this action during the feeding if your milk flows slowly.  Support your breast with 4 fingers underneath and your thumb above your nipple. Make sure your fingers are well away from your nipple and your baby's mouth.  Stroke your baby's lips gently with your finger or nipple.  When your baby's mouth is open wide enough, quickly bring your baby to your breast, placing your entire nipple and as much of the colored area around your nipple (areola) as possible into  your baby's mouth. ? More areola should be visible above your baby's upper lip than below the lower lip. ? Your baby's tongue should be between his or her lower gum and your breast.  Ensure that your baby's mouth is correctly positioned around your nipple (latched). Your baby's lips should create a seal on your breast and be turned out (everted).  It is common for your baby to suck about 2-3 minutes in order to start the flow of breast milk.  Latching Teaching your baby how to latch on to your breast properly is very important. An improper latch can cause nipple pain and decreased milk supply for you and poor weight gain in your baby. Also, if your baby is not latched onto your nipple properly, he or she may swallow some air during feeding. This can make your baby fussy. Burping your baby when you switch breasts during the feeding can help to get rid of the air. However, teaching your baby to latch on properly is still the best way to prevent fussiness from swallowing air while breastfeeding. Signs that your baby has successfully latched on to your nipple:  Silent tugging or silent sucking, without causing you pain.  Swallowing heard between every 3-4 sucks.  Muscle movement above and in front of his or her ears while sucking.  Signs that your baby has not successfully latched on to nipple:  Sucking sounds or smacking sounds from your baby while breastfeeding.  Nipple pain.  If you think your baby has not latched on correctly, slip your finger into the corner of your baby's mouth to break the suction and place it between your baby's gums. Attempt breastfeeding initiation again. Signs of Successful Breastfeeding Signs from your baby:  A   gradual decrease in the number of sucks or complete cessation of sucking.  Falling asleep.  Relaxation of his or her body.  Retention of a small amount of milk in his or her mouth.  Letting go of your breast by himself or herself.  Signs from  you:  Breasts that have increased in firmness, weight, and size 1-3 hours after feeding.  Breasts that are softer immediately after breastfeeding.  Increased milk volume, as well as a change in milk consistency and color by the fifth day of breastfeeding.  Nipples that are not sore, cracked, or bleeding.  Signs That Your Baby is Getting Enough Milk  Wetting at least 1-2 diapers during the first 24 hours after birth.  Wetting at least 5-6 diapers every 24 hours for the first week after birth. The urine should be clear or pale yellow by 5 days after birth.  Wetting 6-8 diapers every 24 hours as your baby continues to grow and develop.  At least 3 stools in a 24-hour period by age 5 days. The stool should be soft and yellow.  At least 3 stools in a 24-hour period by age 7 days. The stool should be seedy and yellow.  No loss of weight greater than 10% of birth weight during the first 3 days of age.  Average weight gain of 4-7 ounces (113-198 g) per week after age 4 days.  Consistent daily weight gain by age 5 days, without weight loss after the age of 2 weeks.  After a feeding, your baby may spit up a small amount. This is common. Breastfeeding frequency and duration Frequent feeding will help you make more milk and can prevent sore nipples and breast engorgement. Breastfeed when you feel the need to reduce the fullness of your breasts or when your baby shows signs of hunger. This is called "breastfeeding on demand." Avoid introducing a pacifier to your baby while you are working to establish breastfeeding (the first 4-6 weeks after your baby is born). After this time you may choose to use a pacifier. Research has shown that pacifier use during the first year of a baby's life decreases the risk of sudden infant death syndrome (SIDS). Allow your baby to feed on each breast as long as he or she wants. Breastfeed until your baby is finished feeding. When your baby unlatches or falls asleep  while feeding from the first breast, offer the second breast. Because newborns are often sleepy in the first few weeks of life, you may need to awaken your baby to get him or her to feed. Breastfeeding times will vary from baby to baby. However, the following rules can serve as a guide to help you ensure that your baby is properly fed:  Newborns (babies 4 weeks of age or younger) may breastfeed every 1-3 hours.  Newborns should not go longer than 3 hours during the day or 5 hours during the night without breastfeeding.  You should breastfeed your baby a minimum of 8 times in a 24-hour period until you begin to introduce solid foods to your baby at around 6 months of age.  Breast milk pumping Pumping and storing breast milk allows you to ensure that your baby is exclusively fed your breast milk, even at times when you are unable to breastfeed. This is especially important if you are going back to work while you are still breastfeeding or when you are not able to be present during feedings. Your lactation consultant can give you guidelines on how   long it is safe to store breast milk. A breast pump is a machine that allows you to pump milk from your breast into a sterile bottle. The pumped breast milk can then be stored in a refrigerator or freezer. Some breast pumps are operated by hand, while others use electricity. Ask your lactation consultant which type will work best for you. Breast pumps can be purchased, but some hospitals and breastfeeding support groups lease breast pumps on a monthly basis. A lactation consultant can teach you how to hand express breast milk, if you prefer not to use a pump. Caring for your breasts while you breastfeed Nipples can become dry, cracked, and sore while breastfeeding. The following recommendations can help keep your breasts moisturized and healthy:  Avoid using soap on your nipples.  Wear a supportive bra. Although not required, special nursing bras and tank  tops are designed to allow access to your breasts for breastfeeding without taking off your entire bra or top. Avoid wearing underwire-style bras or extremely tight bras.  Air dry your nipples for 3-4minutes after each feeding.  Use only cotton bra pads to absorb leaked breast milk. Leaking of breast milk between feedings is normal.  Use lanolin on your nipples after breastfeeding. Lanolin helps to maintain your skin's normal moisture barrier. If you use pure lanolin, you do not need to wash it off before feeding your baby again. Pure lanolin is not toxic to your baby. You may also hand express a few drops of breast milk and gently massage that milk into your nipples and allow the milk to air dry.  In the first few weeks after giving birth, some women experience extremely full breasts (engorgement). Engorgement can make your breasts feel heavy, warm, and tender to the touch. Engorgement peaks within 3-5 days after you give birth. The following recommendations can help ease engorgement:  Completely empty your breasts while breastfeeding or pumping. You may want to start by applying warm, moist heat (in the shower or with warm water-soaked hand towels) just before feeding or pumping. This increases circulation and helps the milk flow. If your baby does not completely empty your breasts while breastfeeding, pump any extra milk after he or she is finished.  Wear a snug bra (nursing or regular) or tank top for 1-2 days to signal your body to slightly decrease milk production.  Apply ice packs to your breasts, unless this is too uncomfortable for you.  Make sure that your baby is latched on and positioned properly while breastfeeding.  If engorgement persists after 48 hours of following these recommendations, contact your health care provider or a lactation consultant. Overall health care recommendations while breastfeeding  Eat healthy foods. Alternate between meals and snacks, eating 3 of each per  day. Because what you eat affects your breast milk, some of the foods may make your baby more irritable than usual. Avoid eating these foods if you are sure that they are negatively affecting your baby.  Drink milk, fruit juice, and water to satisfy your thirst (about 10 glasses a day).  Rest often, relax, and continue to take your prenatal vitamins to prevent fatigue, stress, and anemia.  Continue breast self-awareness checks.  Avoid chewing and smoking tobacco. Chemicals from cigarettes that pass into breast milk and exposure to secondhand smoke may harm your baby.  Avoid alcohol and drug use, including marijuana. Some medicines that may be harmful to your baby can pass through breast milk. It is important to ask your health care   provider before taking any medicine, including all over-the-counter and prescription medicine as well as vitamin and herbal supplements. It is possible to become pregnant while breastfeeding. If birth control is desired, ask your health care provider about options that will be safe for your baby. Contact a health care provider if:  You feel like you want to stop breastfeeding or have become frustrated with breastfeeding.  You have painful breasts or nipples.  Your nipples are cracked or bleeding.  Your breasts are red, tender, or warm.  You have a swollen area on either breast.  You have a fever or chills.  You have nausea or vomiting.  You have drainage other than breast milk from your nipples.  Your breasts do not become full before feedings by the fifth day after you give birth.  You feel sad and depressed.  Your baby is too sleepy to eat well.  Your baby is having trouble sleeping.  Your baby is wetting less than 3 diapers in a 24-hour period.  Your baby has less than 3 stools in a 24-hour period.  Your baby's skin or the white part of his or her eyes becomes yellow.  Your baby is not gaining weight by 5 days of age. Get help right away  if:  Your baby is overly tired (lethargic) and does not want to wake up and feed.  Your baby develops an unexplained fever. This information is not intended to replace advice given to you by your health care provider. Make sure you discuss any questions you have with your health care provider. Document Released: 01/30/2005 Document Revised: 07/14/2015 Document Reviewed: 07/24/2012 Elsevier Interactive Patient Education  2017 Elsevier Inc.  

## 2016-10-27 ENCOUNTER — Ambulatory Visit (INDEPENDENT_AMBULATORY_CARE_PROVIDER_SITE_OTHER): Payer: Medicaid Other

## 2016-10-27 VITALS — BP 114/70 | HR 96 | Wt 194.8 lb

## 2016-10-27 DIAGNOSIS — M329 Systemic lupus erythematosus, unspecified: Secondary | ICD-10-CM

## 2016-10-27 DIAGNOSIS — O99113 Other diseases of the blood and blood-forming organs and certain disorders involving the immune mechanism complicating pregnancy, third trimester: Secondary | ICD-10-CM | POA: Diagnosis not present

## 2016-10-27 DIAGNOSIS — O9989 Other specified diseases and conditions complicating pregnancy, childbirth and the puerperium: Principal | ICD-10-CM

## 2016-10-27 DIAGNOSIS — O99891 Other specified diseases and conditions complicating pregnancy: Secondary | ICD-10-CM

## 2016-10-31 ENCOUNTER — Ambulatory Visit (INDEPENDENT_AMBULATORY_CARE_PROVIDER_SITE_OTHER): Payer: Medicaid Other | Admitting: Obstetrics & Gynecology

## 2016-10-31 VITALS — BP 116/70 | HR 106 | Wt 196.4 lb

## 2016-10-31 DIAGNOSIS — O99119 Other diseases of the blood and blood-forming organs and certain disorders involving the immune mechanism complicating pregnancy, unspecified trimester: Secondary | ICD-10-CM

## 2016-10-31 DIAGNOSIS — D6862 Lupus anticoagulant syndrome: Secondary | ICD-10-CM

## 2016-10-31 DIAGNOSIS — O0993 Supervision of high risk pregnancy, unspecified, third trimester: Secondary | ICD-10-CM

## 2016-10-31 DIAGNOSIS — O99113 Other diseases of the blood and blood-forming organs and certain disorders involving the immune mechanism complicating pregnancy, third trimester: Secondary | ICD-10-CM | POA: Diagnosis not present

## 2016-10-31 DIAGNOSIS — O0990 Supervision of high risk pregnancy, unspecified, unspecified trimester: Secondary | ICD-10-CM

## 2016-10-31 NOTE — Progress Notes (Signed)
Reviewed NST with Dr. Debroah Loop. NST is reactive today. Growth u/s scheduled for Thurs pt agrees.

## 2016-11-01 NOTE — Patient Instructions (Signed)
Third Trimester of Pregnancy The third trimester is from week 28 through week 40 (months 7 through 9). The third trimester is a time when the unborn baby (fetus) is growing rapidly. At the end of the ninth month, the fetus is about 20 inches in length and weighs 6-10 pounds. Body changes during your third trimester Your body will continue to go through many changes during pregnancy. The changes vary from woman to woman. During the third trimester:  Your weight will continue to increase. You can expect to gain 25-35 pounds (11-16 kg) by the end of the pregnancy.  You may begin to get stretch marks on your hips, abdomen, and breasts.  You may urinate more often because the fetus is moving lower into your pelvis and pressing on your bladder.  You may develop or continue to have heartburn. This is caused by increased hormones that slow down muscles in the digestive tract.  You may develop or continue to have constipation because increased hormones slow digestion and cause the muscles that push waste through your intestines to relax.  You may develop hemorrhoids. These are swollen veins (varicose veins) in the rectum that can itch or be painful.  You may develop swollen, bulging veins (varicose veins) in your legs.  You may have increased body aches in the pelvis, back, or thighs. This is due to weight gain and increased hormones that are relaxing your joints.  You may have changes in your hair. These can include thickening of your hair, rapid growth, and changes in texture. Some women also have hair loss during or after pregnancy, or hair that feels dry or thin. Your hair will most likely return to normal after your baby is born.  Your breasts will continue to grow and they will continue to become tender. A yellow fluid (colostrum) may leak from your breasts. This is the first milk you are producing for your baby.  Your belly button may stick out.  You may notice more swelling in your hands,  face, or ankles.  You may have increased tingling or numbness in your hands, arms, and legs. The skin on your belly may also feel numb.  You may feel short of breath because of your expanding uterus.  You may have more problems sleeping. This can be caused by the size of your belly, increased need to urinate, and an increase in your body's metabolism.  You may notice the fetus "dropping," or moving lower in your abdomen (lightening).  You may have increased vaginal discharge.  You may notice your joints feel loose and you may have pain around your pelvic bone.  What to expect at prenatal visits You will have prenatal exams every 2 weeks until week 36. Then you will have weekly prenatal exams. During a routine prenatal visit:  You will be weighed to make sure you and the baby are growing normally.  Your blood pressure will be taken.  Your abdomen will be measured to track your baby's growth.  The fetal heartbeat will be listened to.  Any test results from the previous visit will be discussed.  You may have a cervical check near your due date to see if your cervix has softened or thinned (effaced).  You will be tested for Group B streptococcus. This happens between 35 and 37 weeks.  Your health care provider may ask you:  What your birth plan is.  How you are feeling.  If you are feeling the baby move.  If you have had   any abnormal symptoms, such as leaking fluid, bleeding, severe headaches, or abdominal cramping.  If you are using any tobacco products, including cigarettes, chewing tobacco, and electronic cigarettes.  If you have any questions.  Other tests or screenings that may be performed during your third trimester include:  Blood tests that check for low iron levels (anemia).  Fetal testing to check the health, activity level, and growth of the fetus. Testing is done if you have certain medical conditions or if there are problems during the  pregnancy.  Nonstress test (NST). This test checks the health of your baby to make sure there are no signs of problems, such as the baby not getting enough oxygen. During this test, a belt is placed around your belly. The baby is made to move, and its heart rate is monitored during movement.  What is false labor? False labor is a condition in which you feel small, irregular tightenings of the muscles in the womb (contractions) that usually go away with rest, changing position, or drinking water. These are called Braxton Hicks contractions. Contractions may last for hours, days, or even weeks before true labor sets in. If contractions come at regular intervals, become more frequent, increase in intensity, or become painful, you should see your health care provider. What are the signs of labor?  Abdominal cramps.  Regular contractions that start at 10 minutes apart and become stronger and more frequent with time.  Contractions that start on the top of the uterus and spread down to the lower abdomen and back.  Increased pelvic pressure and dull back pain.  A watery or bloody mucus discharge that comes from the vagina.  Leaking of amniotic fluid. This is also known as your "water breaking." It could be a slow trickle or a gush. Let your health care provider know if it has a color or strange odor. If you have any of these signs, call your health care provider right away, even if it is before your due date. Follow these instructions at home: Medicines  Follow your health care provider's instructions regarding medicine use. Specific medicines may be either safe or unsafe to take during pregnancy.  Take a prenatal vitamin that contains at least 600 micrograms (mcg) of folic acid.  If you develop constipation, try taking a stool softener if your health care provider approves. Eating and drinking  Eat a balanced diet that includes fresh fruits and vegetables, whole grains, good sources of protein  such as meat, eggs, or tofu, and low-fat dairy. Your health care provider will help you determine the amount of weight gain that is right for you.  Avoid raw meat and uncooked cheese. These carry germs that can cause birth defects in the baby.  If you have low calcium intake from food, talk to your health care provider about whether you should take a daily calcium supplement.  Eat four or five small meals rather than three large meals a day.  Limit foods that are high in fat and processed sugars, such as fried and sweet foods.  To prevent constipation: ? Drink enough fluid to keep your urine clear or pale yellow. ? Eat foods that are high in fiber, such as fresh fruits and vegetables, whole grains, and beans. Activity  Exercise only as directed by your health care provider. Most women can continue their usual exercise routine during pregnancy. Try to exercise for 30 minutes at least 5 days a week. Stop exercising if you experience uterine contractions.  Avoid heavy   lifting.  Do not exercise in extreme heat or humidity, or at high altitudes.  Wear low-heel, comfortable shoes.  Practice good posture.  You may continue to have sex unless your health care provider tells you otherwise. Relieving pain and discomfort  Take frequent breaks and rest with your legs elevated if you have leg cramps or low back pain.  Take warm sitz baths to soothe any pain or discomfort caused by hemorrhoids. Use hemorrhoid cream if your health care provider approves.  Wear a good support bra to prevent discomfort from breast tenderness.  If you develop varicose veins: ? Wear support pantyhose or compression stockings as told by your healthcare provider. ? Elevate your feet for 15 minutes, 3-4 times a day. Prenatal care  Write down your questions. Take them to your prenatal visits.  Keep all your prenatal visits as told by your health care provider. This is important. Safety  Wear your seat belt at  all times when driving.  Make a list of emergency phone numbers, including numbers for family, friends, the hospital, and police and fire departments. General instructions  Avoid cat litter boxes and soil used by cats. These carry germs that can cause birth defects in the baby. If you have a cat, ask someone to clean the litter box for you.  Do not travel far distances unless it is absolutely necessary and only with the approval of your health care provider.  Do not use hot tubs, steam rooms, or saunas.  Do not drink alcohol.  Do not use any products that contain nicotine or tobacco, such as cigarettes and e-cigarettes. If you need help quitting, ask your health care provider.  Do not use any medicinal herbs or unprescribed drugs. These chemicals affect the formation and growth of the baby.  Do not douche or use tampons or scented sanitary pads.  Do not cross your legs for long periods of time.  To prepare for the arrival of your baby: ? Take prenatal classes to understand, practice, and ask questions about labor and delivery. ? Make a trial run to the hospital. ? Visit the hospital and tour the maternity area. ? Arrange for maternity or paternity leave through employers. ? Arrange for family and friends to take care of pets while you are in the hospital. ? Purchase a rear-facing car seat and make sure you know how to install it in your car. ? Pack your hospital bag. ? Prepare the baby's nursery. Make sure to remove all pillows and stuffed animals from the baby's crib to prevent suffocation.  Visit your dentist if you have not gone during your pregnancy. Use a soft toothbrush to brush your teeth and be gentle when you floss. Contact a health care provider if:  You are unsure if you are in labor or if your water has broken.  You become dizzy.  You have mild pelvic cramps, pelvic pressure, or nagging pain in your abdominal area.  You have lower back pain.  You have persistent  nausea, vomiting, or diarrhea.  You have an unusual or bad smelling vaginal discharge.  You have pain when you urinate. Get help right away if:  Your water breaks before 37 weeks.  You have regular contractions less than 5 minutes apart before 37 weeks.  You have a fever.  You are leaking fluid from your vagina.  You have spotting or bleeding from your vagina.  You have severe abdominal pain or cramping.  You have rapid weight loss or weight gain.    You have shortness of breath with chest pain.  You notice sudden or extreme swelling of your face, hands, ankles, feet, or legs.  Your baby makes fewer than 10 movements in 2 hours.  You have severe headaches that do not go away when you take medicine.  You have vision changes. Summary  The third trimester is from week 28 through week 40, months 7 through 9. The third trimester is a time when the unborn baby (fetus) is growing rapidly.  During the third trimester, your discomfort may increase as you and your baby continue to gain weight. You may have abdominal, leg, and back pain, sleeping problems, and an increased need to urinate.  During the third trimester your breasts will keep growing and they will continue to become tender. A yellow fluid (colostrum) may leak from your breasts. This is the first milk you are producing for your baby.  False labor is a condition in which you feel small, irregular tightenings of the muscles in the womb (contractions) that eventually go away. These are called Braxton Hicks contractions. Contractions may last for hours, days, or even weeks before true labor sets in.  Signs of labor can include: abdominal cramps; regular contractions that start at 10 minutes apart and become stronger and more frequent with time; watery or bloody mucus discharge that comes from the vagina; increased pelvic pressure and dull back pain; and leaking of amniotic fluid. This information is not intended to replace advice  given to you by your health care provider. Make sure you discuss any questions you have with your health care provider. Document Released: 01/24/2001 Document Revised: 07/08/2015 Document Reviewed: 04/02/2012 Elsevier Interactive Patient Education  2017 [employer]  

## 2016-11-02 ENCOUNTER — Ambulatory Visit (INDEPENDENT_AMBULATORY_CARE_PROVIDER_SITE_OTHER): Payer: Medicaid Other | Admitting: Obstetrics

## 2016-11-02 ENCOUNTER — Other Ambulatory Visit: Payer: Self-pay | Admitting: Family Medicine

## 2016-11-02 ENCOUNTER — Encounter (HOSPITAL_COMMUNITY): Payer: Self-pay

## 2016-11-02 ENCOUNTER — Ambulatory Visit (HOSPITAL_COMMUNITY)
Admission: RE | Admit: 2016-11-02 | Discharge: 2016-11-02 | Disposition: A | Discharge status: Home / Self Care | Payer: Medicaid Other | Source: Ambulatory Visit | Attending: Family Medicine | Admitting: Family Medicine

## 2016-11-02 ENCOUNTER — Encounter: Payer: Self-pay | Admitting: Obstetrics

## 2016-11-02 VITALS — BP 119/67 | HR 88 | Wt 196.4 lb

## 2016-11-02 DIAGNOSIS — O09213 Supervision of pregnancy with history of pre-term labor, third trimester: Secondary | ICD-10-CM

## 2016-11-02 DIAGNOSIS — O0993 Supervision of high risk pregnancy, unspecified, third trimester: Secondary | ICD-10-CM | POA: Diagnosis not present

## 2016-11-02 DIAGNOSIS — O9989 Other specified diseases and conditions complicating pregnancy, childbirth and the puerperium: Secondary | ICD-10-CM | POA: Diagnosis present

## 2016-11-02 DIAGNOSIS — O99891 Other specified diseases and conditions complicating pregnancy: Secondary | ICD-10-CM

## 2016-11-02 DIAGNOSIS — Z3A36 36 weeks gestation of pregnancy: Secondary | ICD-10-CM | POA: Diagnosis not present

## 2016-11-02 DIAGNOSIS — R87612 Low grade squamous intraepithelial lesion on cytologic smear of cervix (LGSIL): Secondary | ICD-10-CM | POA: Diagnosis not present

## 2016-11-02 DIAGNOSIS — O0990 Supervision of high risk pregnancy, unspecified, unspecified trimester: Secondary | ICD-10-CM

## 2016-11-02 DIAGNOSIS — M329 Systemic lupus erythematosus, unspecified: Secondary | ICD-10-CM | POA: Diagnosis not present

## 2016-11-02 DIAGNOSIS — O99113 Other diseases of the blood and blood-forming organs and certain disorders involving the immune mechanism complicating pregnancy, third trimester: Secondary | ICD-10-CM

## 2016-11-02 DIAGNOSIS — O09212 Supervision of pregnancy with history of pre-term labor, second trimester: Secondary | ICD-10-CM

## 2016-11-02 DIAGNOSIS — Z8751 Personal history of pre-term labor: Secondary | ICD-10-CM

## 2016-11-02 NOTE — Progress Notes (Signed)
Patient reports good fetal movement with pressure and irregular contractions. 

## 2016-11-02 NOTE — Progress Notes (Signed)
Subjective:  Lindsey [name] is a 31 y.o. [id number] at [redacted]w[redacted]d being seen today for ongoing prenatal care.  She is currently monitored for the following issues for this high-risk pregnancy and has Supervision of high risk pregnancy, antepartum; Medical history non-contributory; History of lupus; Current pregnancy with history of preterm labor; Low grade squamous intraepithelial lesion (LGSIL) on cervical Pap smear; Low vitamin D level; Systemic lupus complicating pregnancy (HCC); Ptyalism; GERD (gastroesophageal reflux disease); and Unwanted fertility on her problem list.  Patient reports no complaints.  Contractions: Irregular. Vag. Bleeding: None.  Movement: Present. Denies leaking of fluid.   The following portions of the patient's history were reviewed and updated as appropriate: allergies, current medications, past family history, past medical history, past social history, past surgical history and problem list. Problem list updated.  Objective:   Vitals:   11/02/16 0929  BP: 119/67  Pulse: 88  Weight: 196 lb 6.4 oz (89.1 kg)    Fetal Status: Fetal Heart Rate (bpm): NST   Movement: Present     General:  Alert, oriented and cooperative. Patient is in no acute distress.  Skin: Skin is warm and dry. No rash noted.   Cardiovascular: Normal heart rate noted  Respiratory: Normal respiratory effort, no problems with respiration noted  Abdomen: Soft, gravid, appropriate for gestational age. Pain/Pressure: Present     Pelvic:  Cervical exam deferred        Extremities: Normal range of motion.  Edema: None  Mental Status: Normal mood and affect. Normal behavior. Normal judgment and thought content.   Urinalysis:      NST:  Reactive.  Baseline FHR 150's, good variability, 15x15 accels. No decels.  No UC's.  Assessment and Plan:  Pregnancy: [id number] at [redacted]w[redacted]d  1. Supervision of high risk pregnancy, antepartum   2. Systemic lupus complicating pregnancy (HCC) - NST twice a week - AFI once  a week  3. History of preterm delivery - taking 17-P weekly  4. Current pregnancy with history of pre-term labor in second trimester   5. Low grade squamous intraepithelial lesion (LGSIL) on cervical Pap smear   Preterm labor symptoms and general obstetric precautions including but not limited to vaginal bleeding, contractions, leaking of fluid and fetal movement were reviewed in detail with the patient. Please refer to After Visit Summary for other counseling recommendations.  Return in about 4 days (around 11/06/2016) for  NST.   Brock Bad, MD

## 2016-11-02 NOTE — Addendum Note (Signed)
Addended by: Natale Milch D on: 11/02/2016 10:50 AM   Modules accepted: Orders

## 2016-11-03 ENCOUNTER — Other Ambulatory Visit: Payer: Medicaid Other

## 2016-11-04 ENCOUNTER — Encounter (HOSPITAL_COMMUNITY): Payer: Self-pay | Admitting: *Deleted

## 2016-11-04 ENCOUNTER — Inpatient Hospital Stay (HOSPITAL_COMMUNITY)
Admission: AD | Admit: 2016-11-04 | Discharge: 2016-11-05 | Disposition: A | Discharge status: Home / Self Care | Payer: Medicaid Other | Source: Ambulatory Visit | Attending: Obstetrics & Gynecology | Admitting: Obstetrics & Gynecology

## 2016-11-04 DIAGNOSIS — Z3A37 37 weeks gestation of pregnancy: Secondary | ICD-10-CM | POA: Insufficient documentation

## 2016-11-04 DIAGNOSIS — O479 False labor, unspecified: Secondary | ICD-10-CM

## 2016-11-04 DIAGNOSIS — O471 False labor at or after 37 completed weeks of gestation: Secondary | ICD-10-CM | POA: Diagnosis present

## 2016-11-04 LAB — URINALYSIS, ROUTINE W REFLEX MICROSCOPIC
BILIRUBIN URINE: NEGATIVE
Glucose, UA: 50 mg/dL — AB
HGB URINE DIPSTICK: NEGATIVE
Ketones, ur: NEGATIVE mg/dL
LEUKOCYTES UA: NEGATIVE
NITRITE: NEGATIVE
PH: 5 (ref 5.0–8.0)
Protein, ur: 30 mg/dL — AB
SPECIFIC GRAVITY, URINE: 1.026 (ref 1.005–1.030)

## 2016-11-04 LAB — STREP GP B NAA: STREP GROUP B AG: NEGATIVE

## 2016-11-04 NOTE — MAU Note (Signed)
Contractions since Friday night. Been running errands all day and now ctxs stronger. Having a lot of pressure and feels like he is coming out. Denies LOF or bleeding. Hx Lupus

## 2016-11-04 NOTE — Discharge Instructions (Signed)

## 2016-11-05 DIAGNOSIS — O471 False labor at or after 37 completed weeks of gestation: Secondary | ICD-10-CM | POA: Diagnosis not present

## 2016-11-05 MED ORDER — LACTATED RINGERS IV BOLUS (SEPSIS)
1000.0000 mL | Freq: Once | INTRAVENOUS | Status: AC
  Administered 2016-11-05: 1000 mL via INTRAVENOUS

## 2016-11-05 NOTE — MAU Note (Signed)
I have communicated with Dr Frances Furbish Dr Degele and reviewed vital signs:  Vitals:   11/04/16 2137 11/05/16 0021  BP: 116/63 110/65  Pulse: 82 74  Resp: 18 18  Temp: 98.2 F (36.8 C) 98.2 F (36.8 C)    Vaginal exam:  Dilation: Closed (FT external, internal closed) Effacement (%): 40 Cervical Position: Posterior Station: -3 Presentation: Undeterminable Exam by:: [employer] RN,   Also reviewed contraction pattern and that non-stress test is reactive.  It has been documented that patient is contracting every 2-3 minutes with no cervical change over 2 hours not indicating active labor.  Patient denies any other complaints.  Based on this report provider has given order for discharge.  A discharge order and diagnosis entered by a provider.  Dr Frances Furbish discussed plan of care and Labor discharge instructions reviewed with patient.

## 2016-11-08 ENCOUNTER — Telehealth: Payer: Self-pay

## 2016-11-08 NOTE — Telephone Encounter (Signed)
Called pt to schedule her OB appt for this week. Left voicemail.

## 2016-11-10 ENCOUNTER — Other Ambulatory Visit: Payer: Medicaid Other

## 2016-11-13 ENCOUNTER — Encounter (HOSPITAL_COMMUNITY): Payer: Self-pay | Admitting: *Deleted

## 2016-11-13 ENCOUNTER — Inpatient Hospital Stay (HOSPITAL_COMMUNITY)
Admission: AD | Admit: 2016-11-13 | Discharge: 2016-11-14 | DRG: 798 | Disposition: A | Discharge status: Home / Self Care | Payer: Medicaid Other | Source: Ambulatory Visit | Attending: Obstetrics and Gynecology | Admitting: Obstetrics and Gynecology

## 2016-11-13 ENCOUNTER — Encounter: Payer: Medicaid Other | Admitting: Obstetrics & Gynecology

## 2016-11-13 ENCOUNTER — Encounter (HOSPITAL_COMMUNITY)
Admission: AD | Disposition: A | Discharge status: Home / Self Care | Payer: Self-pay | Source: Ambulatory Visit | Attending: Obstetrics and Gynecology

## 2016-11-13 ENCOUNTER — Encounter (HOSPITAL_COMMUNITY): Payer: Self-pay

## 2016-11-13 ENCOUNTER — Inpatient Hospital Stay (HOSPITAL_COMMUNITY): Payer: Medicaid Other | Admitting: Anesthesiology

## 2016-11-13 DIAGNOSIS — O9989 Other specified diseases and conditions complicating pregnancy, childbirth and the puerperium: Secondary | ICD-10-CM | POA: Diagnosis present

## 2016-11-13 DIAGNOSIS — O4290 Premature rupture of membranes, unspecified as to length of time between rupture and onset of labor, unspecified weeks of gestation: Secondary | ICD-10-CM | POA: Diagnosis present

## 2016-11-13 DIAGNOSIS — R87612 Low grade squamous intraepithelial lesion on cytologic smear of cervix (LGSIL): Secondary | ICD-10-CM | POA: Diagnosis present

## 2016-11-13 DIAGNOSIS — Z302 Encounter for sterilization: Secondary | ICD-10-CM | POA: Diagnosis not present

## 2016-11-13 DIAGNOSIS — K219 Gastro-esophageal reflux disease without esophagitis: Secondary | ICD-10-CM | POA: Diagnosis present

## 2016-11-13 DIAGNOSIS — Z87891 Personal history of nicotine dependence: Secondary | ICD-10-CM

## 2016-11-13 DIAGNOSIS — Z3A38 38 weeks gestation of pregnancy: Secondary | ICD-10-CM

## 2016-11-13 DIAGNOSIS — O9962 Diseases of the digestive system complicating childbirth: Principal | ICD-10-CM | POA: Diagnosis present

## 2016-11-13 DIAGNOSIS — O26893 Other specified pregnancy related conditions, third trimester: Secondary | ICD-10-CM | POA: Diagnosis present

## 2016-11-13 DIAGNOSIS — M329 Systemic lupus erythematosus, unspecified: Secondary | ICD-10-CM | POA: Diagnosis present

## 2016-11-13 HISTORY — PX: TUBAL LIGATION: SHX77

## 2016-11-13 LAB — CBC
HCT: 29.1 % — ABNORMAL LOW (ref 36.0–46.0)
HEMOGLOBIN: 10.3 g/dL — AB (ref 12.0–15.0)
MCH: 33.2 pg (ref 26.0–34.0)
MCHC: 35.4 g/dL (ref 30.0–36.0)
MCV: 93.9 fL (ref 78.0–100.0)
PLATELETS: 268 10*3/uL (ref 150–400)
RBC: 3.1 MIL/uL — AB (ref 3.87–5.11)
RDW: 13.8 % (ref 11.5–15.5)
WBC: 11.1 10*3/uL — ABNORMAL HIGH (ref 4.0–10.5)

## 2016-11-13 LAB — TYPE AND SCREEN
ABO/RH(D): O POS
ANTIBODY SCREEN: NEGATIVE

## 2016-11-13 LAB — POCT FERN TEST: POCT Fern Test: POSITIVE

## 2016-11-13 LAB — ABO/RH: ABO/RH(D): O POS

## 2016-11-13 LAB — RPR: RPR Ser Ql: NONREACTIVE

## 2016-11-13 SURGERY — LIGATION, FALLOPIAN TUBE, POSTPARTUM
Anesthesia: Epidural | Site: Abdomen | Laterality: Bilateral | Wound class: Clean Contaminated

## 2016-11-13 MED ORDER — LACTATED RINGERS IV SOLN: 500.0000 mL | Freq: Once | INTRAVENOUS | Status: DC

## 2016-11-13 MED ORDER — SOD CITRATE-CITRIC ACID 500-334 MG/5ML PO SOLN
30.0000 mL | ORAL | Status: DC | PRN
  Administered 2016-11-13: 30 mL via ORAL
  Filled 2016-11-13: qty 15

## 2016-11-13 MED ORDER — SCOPOLAMINE 1 MG/3DAYS TD PT72
MEDICATED_PATCH | TRANSDERMAL | Status: DC | PRN
  Administered 2016-11-13: 1 via TRANSDERMAL

## 2016-11-13 MED ORDER — DIPHENHYDRAMINE HCL 50 MG/ML IJ SOLN: 12.5000 mg | INTRAMUSCULAR | Status: DC | PRN

## 2016-11-13 MED ORDER — EPHEDRINE 5 MG/ML INJ: 10.0000 mg | INTRAVENOUS | Status: DC | PRN

## 2016-11-13 MED ORDER — PRENATAL MULTIVITAMIN CH
1.0000 | ORAL_TABLET | Freq: Every day | ORAL | Status: DC
  Administered 2016-11-14: 1 via ORAL
  Filled 2016-11-13: qty 1

## 2016-11-13 MED ORDER — PHENYLEPHRINE 40 MCG/ML (10ML) SYRINGE FOR IV PUSH (FOR BLOOD PRESSURE SUPPORT)
80.0000 ug | PREFILLED_SYRINGE | INTRAVENOUS | Status: DC | PRN
  Filled 2016-11-13 (×2): qty 10

## 2016-11-13 MED ORDER — FLEET ENEMA 7-19 GM/118ML RE ENEM: 1.0000 | ENEMA | RECTAL | Status: DC | PRN

## 2016-11-13 MED ORDER — BUPIVACAINE HCL (PF) 0.5 % IJ SOLN
INTRAMUSCULAR | Status: DC | PRN
  Administered 2016-11-13: 20 mL

## 2016-11-13 MED ORDER — PHENYLEPHRINE 40 MCG/ML (10ML) SYRINGE FOR IV PUSH (FOR BLOOD PRESSURE SUPPORT): 80.0000 ug | PREFILLED_SYRINGE | INTRAVENOUS | Status: DC | PRN

## 2016-11-13 MED ORDER — LACTATED RINGERS IV SOLN: INTRAVENOUS | Status: DC

## 2016-11-13 MED ORDER — OXYTOCIN 40 UNITS IN LACTATED RINGERS INFUSION - SIMPLE MED
2.5000 [IU]/h | INTRAVENOUS | Status: DC
  Administered 2016-11-13 (×5): 62.5 mL via INTRAVENOUS
  Filled 2016-11-13: qty 1000

## 2016-11-13 MED ORDER — DIPHENHYDRAMINE HCL 25 MG PO CAPS
25.0000 mg | ORAL_CAPSULE | Freq: Four times a day (QID) | ORAL | Status: DC | PRN
Start: 2016-11-13 — End: 2016-11-14

## 2016-11-13 MED ORDER — INFLUENZA VAC SPLIT QUAD 0.5 ML IM SUSY
0.5000 mL | PREFILLED_SYRINGE | INTRAMUSCULAR | Status: DC
  Filled 2016-11-13: qty 0.5

## 2016-11-13 MED ORDER — IBUPROFEN 600 MG PO TABS
600.0000 mg | ORAL_TABLET | Freq: Four times a day (QID) | ORAL | Status: DC
  Administered 2016-11-13 – 2016-11-14 (×5): 600 mg via ORAL
  Filled 2016-11-13 (×5): qty 1

## 2016-11-13 MED ORDER — FENTANYL CITRATE (PF) 100 MCG/2ML IJ SOLN
100.0000 ug | INTRAMUSCULAR | Status: DC | PRN
  Administered 2016-11-13 (×4): 100 ug via INTRAVENOUS
  Filled 2016-11-13 (×4): qty 2

## 2016-11-13 MED ORDER — BENZOCAINE-MENTHOL 20-0.5 % EX AERO
1.0000 "application " | INHALATION_SPRAY | CUTANEOUS | Status: DC | PRN
  Filled 2016-11-13: qty 56

## 2016-11-13 MED ORDER — ONDANSETRON HCL 4 MG/2ML IJ SOLN: 4.0000 mg | INTRAMUSCULAR | Status: DC | PRN

## 2016-11-13 MED ORDER — LACTATED RINGERS IV SOLN: 500.0000 mL | INTRAVENOUS | Status: DC | PRN

## 2016-11-13 MED ORDER — DEXAMETHASONE SODIUM PHOSPHATE 10 MG/ML IJ SOLN
INTRAMUSCULAR | Status: AC
  Filled 2016-11-13: qty 1

## 2016-11-13 MED ORDER — LACTATED RINGERS IV SOLN
INTRAVENOUS | Status: DC
  Administered 2016-11-13 (×3): via INTRAVENOUS

## 2016-11-13 MED ORDER — BUPIVACAINE HCL (PF) 0.5 % IJ SOLN
INTRAMUSCULAR | Status: AC
  Filled 2016-11-13: qty 30

## 2016-11-13 MED ORDER — DIBUCAINE 1 % RE OINT: 1.0000 "application " | TOPICAL_OINTMENT | RECTAL | Status: DC | PRN

## 2016-11-13 MED ORDER — SODIUM CHLORIDE 0.9 % IR SOLN
Status: DC | PRN
  Administered 2016-11-13: 1000 mL

## 2016-11-13 MED ORDER — MEPERIDINE HCL 25 MG/ML IJ SOLN: 6.2500 mg | INTRAMUSCULAR | Status: DC | PRN

## 2016-11-13 MED ORDER — ONDANSETRON HCL 4 MG/2ML IJ SOLN
INTRAMUSCULAR | Status: DC | PRN
  Administered 2016-11-13: 4 mg via INTRAVENOUS

## 2016-11-13 MED ORDER — ONDANSETRON HCL 4 MG/2ML IJ SOLN
INTRAMUSCULAR | Status: AC
  Filled 2016-11-13: qty 2

## 2016-11-13 MED ORDER — ONDANSETRON HCL 4 MG/2ML IJ SOLN
4.0000 mg | Freq: Four times a day (QID) | INTRAMUSCULAR | Status: DC | PRN
  Administered 2016-11-13: 4 mg via INTRAVENOUS
  Filled 2016-11-13: qty 2

## 2016-11-13 MED ORDER — COCONUT OIL OIL
1.0000 "application " | TOPICAL_OIL | Status: DC | PRN
  Administered 2016-11-14: 1 via TOPICAL
  Filled 2016-11-13: qty 120

## 2016-11-13 MED ORDER — LIDOCAINE-EPINEPHRINE (PF) 2 %-1:200000 IJ SOLN
INTRAMUSCULAR | Status: DC | PRN
  Administered 2016-11-13: 5 mL via PERINEURAL
  Administered 2016-11-13: 10 mL via PERINEURAL

## 2016-11-13 MED ORDER — SCOPOLAMINE 1 MG/3DAYS TD PT72
MEDICATED_PATCH | TRANSDERMAL | Status: AC
  Filled 2016-11-13: qty 1

## 2016-11-13 MED ORDER — ONDANSETRON HCL 4 MG PO TABS: 4.0000 mg | ORAL_TABLET | ORAL | Status: DC | PRN

## 2016-11-13 MED ORDER — FENTANYL 2.5 MCG/ML BUPIVACAINE 1/10 % EPIDURAL INFUSION (WH - ANES)
14.0000 mL/h | INTRAMUSCULAR | Status: DC | PRN
  Administered 2016-11-13 (×2): 14 mL/h via EPIDURAL
  Filled 2016-11-13 (×2): qty 100

## 2016-11-13 MED ORDER — ACETAMINOPHEN 325 MG PO TABS
650.0000 mg | ORAL_TABLET | ORAL | Status: DC | PRN
  Administered 2016-11-13 (×2): 650 mg via ORAL
  Filled 2016-11-13 (×2): qty 2

## 2016-11-13 MED ORDER — FENTANYL CITRATE (PF) 100 MCG/2ML IJ SOLN: 25.0000 ug | INTRAMUSCULAR | Status: DC | PRN

## 2016-11-13 MED ORDER — ZOLPIDEM TARTRATE 5 MG PO TABS: 5.0000 mg | ORAL_TABLET | Freq: Every evening | ORAL | Status: DC | PRN

## 2016-11-13 MED ORDER — LIDOCAINE-EPINEPHRINE (PF) 2 %-1:200000 IJ SOLN
INTRAMUSCULAR | Status: AC
  Filled 2016-11-13: qty 20

## 2016-11-13 MED ORDER — LIDOCAINE HCL (PF) 1 % IJ SOLN
INTRAMUSCULAR | Status: DC | PRN
  Administered 2016-11-13 (×2): 5 mL

## 2016-11-13 MED ORDER — SIMETHICONE 80 MG PO CHEW: 80.0000 mg | CHEWABLE_TABLET | ORAL | Status: DC | PRN

## 2016-11-13 MED ORDER — TETANUS-DIPHTH-ACELL PERTUSSIS 5-2.5-18.5 LF-MCG/0.5 IM SUSP: 0.5000 mL | Freq: Once | INTRAMUSCULAR | Status: DC

## 2016-11-13 MED ORDER — DEXAMETHASONE SODIUM PHOSPHATE 4 MG/ML IJ SOLN
INTRAMUSCULAR | Status: DC | PRN
  Administered 2016-11-13: 10 mg via INTRAVENOUS

## 2016-11-13 MED ORDER — LACTATED RINGERS IV SOLN
500.0000 mL | Freq: Once | INTRAVENOUS | Status: AC
  Administered 2016-11-13: 500 mL via INTRAVENOUS

## 2016-11-13 MED ORDER — WITCH HAZEL-GLYCERIN EX PADS: 1.0000 "application " | MEDICATED_PAD | CUTANEOUS | Status: DC | PRN

## 2016-11-13 MED ORDER — METOCLOPRAMIDE HCL 5 MG/ML IJ SOLN: 10.0000 mg | Freq: Once | INTRAMUSCULAR | Status: DC | PRN

## 2016-11-13 MED ORDER — ACETAMINOPHEN 325 MG PO TABS: 650.0000 mg | ORAL_TABLET | ORAL | Status: DC | PRN

## 2016-11-13 MED ORDER — SENNOSIDES-DOCUSATE SODIUM 8.6-50 MG PO TABS
2.0000 | ORAL_TABLET | ORAL | Status: DC
  Administered 2016-11-14: 2 via ORAL
  Filled 2016-11-13: qty 2

## 2016-11-13 MED ORDER — OXYTOCIN BOLUS FROM INFUSION
500.0000 mL | Freq: Once | INTRAVENOUS | Status: AC
  Administered 2016-11-13: 500 mL via INTRAVENOUS

## 2016-11-13 MED ORDER — LIDOCAINE HCL (PF) 1 % IJ SOLN
30.0000 mL | INTRAMUSCULAR | Status: DC | PRN
  Filled 2016-11-13: qty 30

## 2016-11-13 SURGICAL SUPPLY — 25 items
BLADE SURG 11 STRL SS (BLADE) ×3 IMPLANT
CLIP FILSHIE TUBAL LIGA STRL (Clip) IMPLANT
CLOTH BEACON ORANGE TIMEOUT ST (SAFETY) ×3 IMPLANT
DRSG OPSITE POSTOP 3X4 (GAUZE/BANDAGES/DRESSINGS) ×3 IMPLANT
DURAPREP 26ML APPLICATOR (WOUND CARE) ×3 IMPLANT
ELECT REM PT RETURN 9FT ADLT (ELECTROSURGICAL) ×3
ELECTRODE REM PT RTRN 9FT ADLT (ELECTROSURGICAL) ×1 IMPLANT
GLOVE BIOGEL PI IND STRL 7.0 (GLOVE) ×3 IMPLANT
GLOVE BIOGEL PI INDICATOR 7.0 (GLOVE) ×6
GLOVE ECLIPSE 7.0 STRL STRAW (GLOVE) ×3 IMPLANT
GOWN STRL REUS W/TWL LRG LVL3 (GOWN DISPOSABLE) ×6 IMPLANT
NEEDLE HYPO 22GX1.5 SAFETY (NEEDLE) ×3 IMPLANT
NS IRRIG 1000ML POUR BTL (IV SOLUTION) ×3 IMPLANT
PACK ABDOMINAL MINOR (CUSTOM PROCEDURE TRAY) ×3 IMPLANT
PENCIL BUTTON HOLSTER BLD 10FT (ELECTRODE) ×3 IMPLANT
PROTECTOR NERVE ULNAR (MISCELLANEOUS) ×3 IMPLANT
SPONGE LAP 4X18 X RAY DECT (DISPOSABLE) ×3 IMPLANT
SUT VIC AB 0 CT1 27 (SUTURE) ×2
SUT VIC AB 0 CT1 27XBRD ANBCTR (SUTURE) ×1 IMPLANT
SUT VIC AB 2-0 CT1 (SUTURE) ×6 IMPLANT
SUT VICRYL 4-0 PS2 18IN ABS (SUTURE) ×3 IMPLANT
SYR CONTROL 10ML LL (SYRINGE) ×3 IMPLANT
TOWEL OR 17X24 6PK STRL BLUE (TOWEL DISPOSABLE) ×6 IMPLANT
TRAY FOLEY CATH SILVER 14FR (SET/KITS/TRAYS/PACK) ×3 IMPLANT
WATER STERILE IRR 1000ML POUR (IV SOLUTION) ×3 IMPLANT

## 2016-11-13 NOTE — Anesthesia Preprocedure Evaluation (Signed)
Anesthesia Evaluation  Patient identified by MRN, date of birth, ID band Patient awake    Reviewed: Allergy & Precautions, NPO status , Patient's Chart, lab work & pertinent test results  Airway Mallampati: II  TM Distance: >3 FB Neck ROM: Full    Dental no notable dental hx.    Pulmonary asthma , former smoker,    Pulmonary exam normal breath sounds clear to auscultation       Cardiovascular negative cardio ROS Normal cardiovascular exam Rhythm:Regular Rate:Normal     Neuro/Psych negative neurological ROS  negative psych ROS   GI/Hepatic negative GI ROS, Neg liver ROS,   Endo/Other  negative endocrine ROS  Renal/GU negative Renal ROS  negative genitourinary   Musculoskeletal SLE   Abdominal   Peds negative pediatric ROS (+)  Hematology negative hematology ROS (+)   Anesthesia Other Findings   Reproductive/Obstetrics negative OB ROS                             Anesthesia Physical Anesthesia Plan  ASA: II  Anesthesia Plan: Epidural   Post-op Pain Management:    Induction:   PONV Risk Score and Plan: 3 and Ondansetron  Airway Management Planned: Natural Airway  Additional Equipment:   Intra-op Plan:   Post-operative Plan:   Informed Consent: I have reviewed the patients History and Physical, chart, labs and discussed the procedure including the risks, benefits and alternatives for the proposed anesthesia with the patient or authorized representative who has indicated his/her understanding and acceptance.   Dental advisory given  Plan Discussed with: CRNA  Anesthesia Plan Comments:         Anesthesia Quick Evaluation

## 2016-11-13 NOTE — Anesthesia Postprocedure Evaluation (Signed)
Anesthesia Post Note  Patient: Lindsey [name]  Procedure(s) Performed: POST PARTUM PARTIAL SALPINGECTOMY (Bilateral Abdomen)     Patient location during evaluation: Mother Baby Anesthesia Type: Epidural Level of consciousness: awake Pain management: satisfactory to patient Vital Signs Assessment: post-procedure vital signs reviewed and stable Respiratory status: spontaneous breathing Cardiovascular status: stable Anesthetic complications: no    Last Vitals:  Vitals:   11/13/16 1620 11/13/16 1735  BP: 124/76 125/61  Pulse: 62 60  Resp: 20 18  Temp: 36.8 C 36.8 C  SpO2:      Last Pain:  Vitals:   11/13/16 1738  TempSrc:   PainSc: 5    Pain Goal:                 [employer]

## 2016-11-13 NOTE — MAU Note (Signed)
Pt presents to MAU c/o SROM that occurred . Pt states the fluid is clear and reports some bloody show. Pt reports good FM and ctxs that are every .   Vaginal Exam: 3 60% -2 station vertex

## 2016-11-13 NOTE — Anesthesia Pain Management Evaluation Note (Signed)
  CRNA Pain Management Visit Note  Patient: Lindsey [name], 31 y.o., female  "Hello I am a member of the anesthesia team at [facility]. We have an anesthesia team available at all times to provide care throughout the hospital, including epidural management and anesthesia for C-section. I don't know your plan for the delivery whether it a natural birth, water birth, IV sedation, nitrous supplementation, doula or epidural, but we want to meet your pain goals."   1.Was your pain managed to your expectations on prior hospitalizations?   Yes   2.What is your expectation for pain management during this hospitalization?     Epidural  3.How can we help you reach that goal? epidural  Record the patient's initial score and the patient's pain goal.   Pain: 0  Pain Goal: 0 [facility] wants you to be able to say your pain was always managed very well.  Merik Mignano 11/13/2016

## 2016-11-13 NOTE — Progress Notes (Signed)
Faculty Practice OB/GYN Attending Note   31 y.o. [phone] s/p SVD desires permanent sterilization.  Other reversible forms of contraception were discussed with patient; she declines all other modalities. Risks of procedure discussed with patient including but not limited to: risk of regret, permanence of method, bleeding, infection, injury to surrounding organs and need for additional procedures.  Failure risk of 1-2 % with increased risk of ectopic gestation if pregnancy occurs was also discussed with patient.  Patient verbalized understanding of these risks and wants to proceed with sterilization.  Written informed consent obtained.  NPO until procedure.  To OR when ready.   Jaynie Collins, MD, FACOG Attending Obstetrician & Gynecologist Faculty Practice, [facility]

## 2016-11-13 NOTE — Anesthesia Preprocedure Evaluation (Addendum)
Anesthesia Evaluation  Patient identified by MRN, date of birth, ID band Patient awake    Reviewed: Allergy & Precautions, NPO status , Patient's Chart, lab work & pertinent test results  History of Anesthesia Complications Negative for: history of anesthetic complications  Airway Mallampati: II  TM Distance: >3 FB Neck ROM: Full    Dental  (+) Dental Advisory Given   Pulmonary asthma (no inhaler needed in over a year) , former smoker,    breath sounds clear to auscultation       Cardiovascular negative cardio ROS   Rhythm:Regular Rate:Normal     Neuro/Psych negative neurological ROS     GI/Hepatic Neg liver ROS, GERD  ,  Endo/Other  Lupus  Renal/GU negative Renal ROS     Musculoskeletal   Abdominal (+) - obese,   Peds  Hematology plt 268k   Anesthesia Other Findings   Reproductive/Obstetrics (+) Pregnancy                            Anesthesia Physical Anesthesia Plan  ASA: II  Anesthesia Plan: Epidural   Post-op Pain Management:    Induction:   PONV Risk Score and Plan: Treatment may vary due to age or medical condition  Airway Management Planned: Natural Airway  Additional Equipment:   Intra-op Plan:   Post-operative Plan:   Informed Consent: I have reviewed the patients History and Physical, chart, labs and discussed the procedure including the risks, benefits and alternatives for the proposed anesthesia with the patient or authorized representative who has indicated his/her understanding and acceptance.   Dental advisory given  Plan Discussed with:   Anesthesia Plan Comments: (Patient identified. Risks/Benefits/Options discussed with patient including but not limited to bleeding, infection, nerve damage, paralysis, failed block, incomplete pain control, headache, blood pressure changes, nausea, vomiting, reactions to medication both or allergic, itching and postpartum  back pain. Confirmed with bedside nurse the patient's most recent platelet count. Confirmed with patient that they are not currently taking any anticoagulation, have any bleeding history or any family history of bleeding disorders. Patient expressed understanding and wished to proceed. All questions were answered. )        Anesthesia Quick Evaluation

## 2016-11-13 NOTE — Op Note (Signed)
Lindsey [name] 11/13/2016  PREOPERATIVE DIAGNOSES: Multiparity, undesired fertility  POSTOPERATIVE DIAGNOSES: Multiparity, undesired fertility  PROCEDURE:  Postpartum Bilateral Partial Salpingectomy  SURGEON: Dr. Jaynie Collins and Dr. Rolm Bookbinder  ANESTHESIA:  Epidural and local analgesia using 20 ml of 0.5% Marcaine  COMPLICATIONS:  None immediate.  ESTIMATED BLOOD LOSS: 10 ml.  INDICATIONS:  31 y.o. [id number] with undesired fertility, status post vaginal delivery, desires permanent sterilization.  Other reversible forms of contraception were discussed with patient; she declines all other modalities. Risks of procedure discussed with patient including but not limited to: risk of regret, permanence of method, bleeding, infection, injury to surrounding organs and need for additional procedures.  Failure risk of 1% with increased risk of ectopic gestation if pregnancy occurs was also discussed with patient.      FINDINGS:  Normal uterus, tubes, and ovaries.  PROCEDURE DETAILS: The patient was taken to the operating room where her epidural anesthesia was dosed up to surgical level and found to be adequate.  She was then placed in the dorsal supine position and prepped and draped in sterile fashion.  After an adequate timeout was performed, attention was turned to the patient's abdomen where a small transverse skin incision was made under the umbilical fold. The incision was taken down to the layer of fascia using the scalpel, and fascia was incised, and extended bilaterally using Mayo scissors. The peritoneum was entered in a sharp fashion. Attention was then turned to the patient's uterus, the right fallopian tube was grasped with Babcock clamps and followed out to the fimbriated end. The distal 5 cm portion of the tube and mesosalpinx was doubly clamped using Kelly clamps and the distal portion of the tube removed using Metzenbaum scissors. The remaining pedicle was tied off using a 2-0 Vicryl  free tie and a subsequent suture ligation with 2-0 Vicryl. The pedicle was inspected and found to be hemostatic.  A similar process was carried out on the left side allowing for bilateral tubal sterilization.  Good hemostasis was noted overall. The instruments were then removed from the patient's abdomen and the fascial incision was repaired with 0 Vicryl, and the skin was closed with a 4-0 Vicryl subcuticular stitch. The patient tolerated the procedure well.  Instrument, sponge, and needle counts were correct times two.  The patient was then taken to the recovery room awake and in stable condition.   Jaynie Collins, MD, FACOG Attending Obstetrician & Gynecologist Faculty Practice, [facility]

## 2016-11-13 NOTE — Transfer of Care (Signed)
Immediate Anesthesia Transfer of Care Note  Patient: Lindsey [name]  Procedure(s) Performed: POST PARTUM PARTIAL SALPINGECTOMY (Bilateral Abdomen)  Patient Location: PACU  Anesthesia Type:Epidural  Level of Consciousness: awake, alert  and oriented  Airway & Oxygen Therapy: Patient Spontanous Breathing  Post-op Assessment: Report given to RN and Post -op Vital signs reviewed and stable  Post vital signs: Reviewed and stable  Last Vitals:  Vitals:   11/13/16 1335 11/13/16 1500  BP: 109/90 112/61  Pulse: 69 82  Resp: 18 (!) 21  Temp:  36.9 C  SpO2:  100%    Last Pain:  Vitals:   11/13/16 1500  TempSrc: Oral  PainSc: 0-No pain         Complications: No apparent anesthesia complications

## 2016-11-13 NOTE — H&P (Signed)
Obstetric History and Physical  Lindsey [name] is a 31 y.o. [phone] with IUP at [redacted]w[redacted]d presenting for SROM . Prenatal course complicated with SLE(not on medication, LGSIL (need pp colpo), and h/o preterm labor(received 17-p). Patient states she has been having  regular, every 2-3 minutes contractions, minimal vaginal bleeding, ruptured membranes, with active fetal movement.  No blurry vision, headaches or peripheral edema, and RUQ pain.   Prenatal Course Source of Care: [place]  Dating: By LMP --->  Estimated Date of Delivery: 11/27/16 Pregnancy complications or risks: Patient Active Problem List   Diagnosis Date Noted  . Unwanted fertility 09/21/2016  . Ptyalism 08/09/2016  . GERD (gastroesophageal reflux disease) 08/09/2016  . Systemic lupus complicating pregnancy (HCC) 06/30/2016  . Low vitamin D level 05/15/2016  . Low grade squamous intraepithelial lesion (LGSIL) on cervical Pap smear 05/11/2016  . Supervision of high risk pregnancy, antepartum 05/08/2016  . History of lupus 05/08/2016  . Current pregnancy with history of preterm labor 05/08/2016  . Medical history non-contributory    She plans to breastfeed She desires bilateral tubal ligation for postpartum contraception.   Sono:    , CWD, normal anatomy, cephalic presentation, anterior placenta, 2882g, 62% EFW  Prenatal labs and studies: ABO, Rh: O/Positive/-- (03/26 1018) Antibody: Negative (03/26 1018) Rubella: 1.24 (03/26 1018) RPR: Non Reactive (07/26 1050)  HBsAg: Negative (03/26 1018)  HIV:   Non-Reactive AVW:UJWJXBJY (09/20 1033) 2 hr Glucola  nml Genetic screening normal Anatomy US normal  Prenatal Transfer Tool  Maternal Diabetes: No Genetic Screening: Normal Maternal Ultrasounds/Referrals: Normal Fetal Ultrasounds or other Referrals:  None Maternal Substance Abuse:  No Significant Maternal Medications:  None Significant Maternal Lab Results: Lab values include: Group B Strep negative  Past  Medical History:  Diagnosis Date  . Anemia   . Asthma   . Medical history non-contributory   . Systemic lupus erythematosus (HCC)     Past Surgical History:  Procedure Laterality Date  . CHOLECYSTECTOMY    . TOOTH EXTRACTION      OB History  Gravida Para Term Preterm AB Living  SAB TAB Ectopic Multiple Live Births  0 1     2    # Outcome Date GA Lbr Len/2nd Weight Sex Delivery Anes PTL Lv  4 Current           3 Preterm 10/04/12 [redacted]w[redacted]d  2.325 kg (5 lb 2 oz) F Vag-Spont   LIV  2 TAB 2009 [redacted]w[redacted]d         1 Term 12/19/05 [redacted]w[redacted]d  4.848 kg (10 lb 11 oz) M Vag-Spont None  LIV      Social History   Social History  . Marital status: Single    Spouse name: N/A  . Number of children: N/A  . Years of education: N/A   Social History Main Topics  . Smoking status: Former Smoker    Quit date: 04/18/2011  . Smokeless tobacco: Never Used  . Alcohol use No  . Drug use: No  . Sexual activity: Yes    Birth control/ protection: None   Other Topics Concern  . None   Social History Narrative  . None    Family History  Problem Relation Age of Onset  . Diabetes Mother   . Diabetes Paternal Grandmother     Facility-Administered Medications Prior to Admission  Medication Dose Route Frequency Provider Last Rate Last Dose  . hydroxyprogesterone caproate (MAKENA) 250 mg/mL injection 250 mg  250  mg Intramuscular Once Hermina Staggers, MD      . hydroxyprogesterone caproate (MAKENA) 250 mg/mL injection 250 mg  250 mg Intramuscular Once Adam Phenix, MD      . hydroxyprogesterone caproate (MAKENA) 250 mg/mL injection 250 mg  250 mg Intramuscular Once Hermina Staggers, MD       Prescriptions Prior to Admission  Medication Sig Dispense Refill Last Dose  . ferrous sulfate (FERROUSUL) 325 (65 FE) MG tablet Take 1 tablet (325 mg total) by mouth 2 (two) times daily. (Patient not taking: Reported on 10/24/2016) 60 tablet 1 Not Taking  . hydroxychloroquine (PLAQUENIL) 200 MG tablet  Take 1 tablet (200 mg total) by mouth daily. (Patient not taking: Reported on 10/24/2016) 30 tablet 6 Not Taking  . NIFEdipine (PROCARDIA) 10 MG capsule Take 1 capsule (10 mg total) by mouth every 4 (four) hours as needed. (Patient not taking: Reported on 10/31/2016) 30 capsule 2 Not Taking  . pantoprazole (PROTONIX) 40 MG tablet Take 1 tablet (40 mg total) by mouth daily. 30 tablet 2 Unknown at Unknown time  . predniSONE (DELTASONE) 10 MG tablet Take 1 tablet (10 mg total) by mouth daily with breakfast. (Patient not taking: Reported on 10/24/2016) 30 tablet 2 Not Taking  . Prenat-FeAsp-Meth-FA-DHA w/o A (PRENATE PIXIE) 10-0.6-0.4-200 MG CAPS Take 1 tablet by mouth daily. 30 capsule 12 Unknown at Unknown time  . Vitamin D, Ergocalciferol, (DRISDOL) 50000 units CAPS capsule Take 1 capsule (50,000 Units total) by mouth every 7 (seven) days. (Patient not taking: Reported on 10/27/2016) 30 capsule 2 Not Taking    Allergies  Allergen Reactions  . Bee Venom Anaphylaxis  . Pineapple Shortness Of Breath and Itching  . Codeine Nausea And Vomiting    Review of Systems: Negative except for what is mentioned in HPI.  Physical Exam: BP 128/72   Pulse 82   Temp 98.2 F (36.8 C) (Oral)   Ht  (1.6 m)   Wt 85.3 kg (188 lb)   LMP 02/21/2016 (Exact Date)   SpO2 98%   BMI 33.30 kg/m  CONSTITUTIONAL: Well-developed, well-nourished female in no acute distress.  HENT:  Normocephalic, atraumatic, External right and left ear normal. Oropharynx is clear and moist EYES: Conjunctivae and EOM are normal. Pupils are equal, round, and reactive to light. No scleral icterus.  NECK: Normal range of motion, supple, no masses SKIN: Skin is warm and dry. No rash noted. Not diaphoretic. No erythema. No pallor. NEUROLOGIC: Alert and oriented to person, place, and time. Normal reflexes, muscle tone coordination. No cranial nerve deficit noted. PSYCHIATRIC: Normal mood and affect. Normal behavior. Normal judgment and  thought content. CARDIOVASCULAR: Normal heart rate noted, regular rhythm RESPIRATORY: Effort and breath sounds normal, no problems with respiration noted ABDOMEN: Soft, nontender, nondistended, gravid. MUSCULOSKELETAL: Normal range of motion. No edema and no tenderness. 2+ distal pulses.  Dilation: 3 Effacement (%): 60 Station: -2 Presentation: Vertex Exam by:: amber stovall rn   Presentation: cephalic FHT:  Baseline rate 145 bpm   Variability moderate  Accelerations present   Decelerations none Contractions: Every 1-4 mins   Pertinent Labs/Studies:   Results for orders placed or performed during the hospital encounter of 11/13/16 (from the past 24 hour(s))  POCT fern test     Status: Abnormal   Collection Time: 11/13/16  1:55 AM  Result Value Ref Range   POCT Fern Test Positive = ruptured amniotic membanes     Assessment : Lindsey [name] is a 31 y.o. [id number]  at [redacted]w[redacted]d being admitted for SROM.  Plan: Labor: Expectant management. Will see if patient advances to SOL. Augmentation as ordered as per protocol. Plan for epidural FWB: Reassuring fetal heart tracing.  GBS negative Desires BTL after delivery Delivery plan: Hopeful for vaginal delivery   Caryl Ada, DO OB Fellow Faculty Practice, [facility] 11/13/2016, 2:01 AM

## 2016-11-13 NOTE — Anesthesia Procedure Notes (Addendum)
Epidural Patient location during procedure: OB Start time: 11/13/2016 7:30 AM  Staffing Anesthesiologist: Phillips Grout Performed: anesthesiologist   Preanesthetic Checklist Completed: patient identified, site marked, surgical consent, pre-op evaluation, timeout performed, IV checked, risks and benefits discussed and monitors and equipment checked  Epidural Patient position: sitting Prep: DuraPrep Patient monitoring: heart rate, continuous pulse ox and blood pressure Approach: right paramedian Location: L3-L4 Injection technique: LOR saline  Needle:  Needle type: Tuohy  Needle gauge: 17 G Needle length: 9 cm and 9 Needle insertion depth: 7 cm Catheter type: closed end flexible Catheter size: 20 Guage Catheter at skin depth: 12 cm Test dose: negative  Assessment Events: blood not aspirated, injection not painful, no injection resistance, negative IV test and no paresthesia  Additional Notes Patient identified. Risks/Benefits/Options discussed with patient including but not limited to bleeding, infection, nerve damage, paralysis, failed block, incomplete pain control, headache, blood pressure changes, nausea, vomiting, reactions to medication both or allergic, itching and postpartum back pain. Confirmed with bedside nurse the patient's most recent platelet count. Confirmed with patient that they are not currently taking any anticoagulation, have any bleeding history or any family history of bleeding disorders. Patient expressed understanding and wished to proceed. All questions were answered. Sterile technique was used throughout the entire procedure. Please see nursing notes for vital signs. Test dose was given through epidural needle and negative prior to continuing to dose epidural or start infusion. Warning signs of high block given to the patient including shortness of breath, tingling/numbness in hands, complete motor block, or any concerning symptoms with instructions to call  for help. Patient was given instructions on fall risk and not to get out of bed. All questions and concerns addressed with instructions to call with any issues.

## 2016-11-13 NOTE — Progress Notes (Signed)
Labor Progress Note Lindsey [name] is a 31 y.o. [id number] at [redacted]w[redacted]d presented for SROM at 0019 this am. S: Feeling well with epidural  O:  BP 111/65   Pulse 76   Temp 98.2 F (36.8 C) (Oral)   Resp 20   Ht  (1.6 m)   Wt 188 lb (85.3 kg)   LMP 02/21/2016 (Exact Date)   SpO2 100%   BMI 33.30 kg/m  EFM: 150/mod var/no decels  CVE: Dilation: 6.5 Effacement (%): 100 Cervical Position: Anterior Station: 0, +1 Presentation: Vertex Exam by:: Lorretta Harp RNC   A&P: 31 y.o. [id number] [redacted]w[redacted]d here for SROM. #Labor: Progressing well.  #Pain: epidrual #FWB: cat 1 #GBS negative  Lindsey Igo, DO 9:33 AM

## 2016-11-13 NOTE — Anesthesia Procedure Notes (Signed)
Anesthesia Procedure Note Lumbar epidural:  Pt identified in Labor room.  Monitors applied. Working IV access confirmed. Sitting position, Sterile prep, drape lumbar spine.  1% lido local L 2,3.  #17ga Touhy, all attempts os, repeat local L 3,4 and again only os. Pt elects no epidural. Preocedure abandoned.  VSS, tolerated well.  Sandford Craze, MD

## 2016-11-14 MED ORDER — IBUPROFEN 600 MG PO TABS: 600.0000 mg | ORAL_TABLET | Freq: Four times a day (QID) | ORAL | 0 refills | Status: DC

## 2016-11-14 MED ORDER — PRENATAL MULTIVITAMIN CH: 1.0000 | ORAL_TABLET | Freq: Every day | ORAL | 1 refills | Status: AC

## 2016-11-14 MED ORDER — SENNOSIDES-DOCUSATE SODIUM 8.6-50 MG PO TABS: 2.0000 | ORAL_TABLET | Freq: Every evening | ORAL | 1 refills | Status: DC | PRN

## 2016-11-14 NOTE — Lactation Note (Signed)
This note was copied from a baby's chart. Lactation Consultation Note Experienced BF mom for 2 1/2 yrs for her 30 yr old and her 30 yr old. Had no issues. Mom has small breast w/everted nipples. Mom denies having to supplement her other two children.  Newborn behavior reviewed, STS, I&O, cluster feeding, latching tips. Discuss feeding positions, support and props.  Encouraged to call for assistance. Mom encouraged to feed baby 8-12 times/24 hours and with feeding cues.  [facility] brochure given w/resources, support groups and LC services. Patient Name: Lindsey [name] Today's Date: 11/14/2016 Reason for consult: Initial assessment   Maternal Data Has patient been taught Hand Expression?: Yes Does the patient have breastfeeding experience prior to this delivery?: Yes  Feeding Feeding Type: Breast Fed Length of feed: 30 min  LATCH Score Latch: Repeated attempts needed to sustain latch, nipple held in mouth throughout feeding, stimulation needed to elicit sucking reflex.  Audible Swallowing: A few with stimulation  Type of Nipple: Everted at rest and after stimulation  Comfort (Breast/Nipple): Soft / non-tender  Hold (Positioning): Assistance needed to correctly position infant at breast and maintain latch.  LATCH Score: 7  Interventions Interventions: Breast feeding basics reviewed;Breast compression;Support pillows;Position options  Lactation Tools Discussed/Used     Consult Status Consult Status: Follow-up Date: 11/15/16 Follow-up type: In-patient    Charyl Dancer 11/14/2016, 6:57 AM

## 2016-11-14 NOTE — Discharge Summary (Signed)
OB Discharge Summary     Patient Name: Lindsey [name] DOB: 09/25/1985 MRN: [id number]  Date of admission: 11/13/2016 Delivering MD: Rolm Bookbinder   Date of discharge: 11/14/2016  Admitting diagnosis: 38WKS WATER BROKE CTX PPBTL Intrauterine pregnancy: [redacted]w[redacted]d     Secondary diagnosis:  Active Problems:   SVD (spontaneous vaginal delivery)  Additional problems: Systemic Lupus complicating pregnancy, LGSIL     Discharge diagnosis: Term Pregnancy Delivered                                                                                                Post partum procedures:postpartum tubal ligation  Augmentation: None  Complications: None  Hospital course:  Onset of Labor With Vaginal Delivery     31 y.o. yo [phone] at [redacted]w[redacted]d was admitted in Latent Labor on 11/13/2016. Patient had an uncomplicated labor course as follows:  Membrane Rupture Time/Date: 12:19 AM ,11/13/2016   Intrapartum Procedures: Episiotomy: None [1]                                         Lacerations:  Labial [10]  Patient had a delivery of a Viable infant. 11/13/2016  Information for the patient's newborn:  Marcellina, Jonsson [[id number]]  Delivery Method: Vaginal, Spontaneous Delivery (Filed from Delivery Summary)    Pateint had an uncomplicated postpartum course.  She is ambulating, tolerating a regular diet, passing flatus, and urinating well. Patient is discharged home in stable condition on 11/14/16.   Physical exam  Vitals:   11/13/16 1735 11/13/16 2130 11/14/16 0135 11/14/16 0531  BP: 125/61 123/67 122/60 (!) 100/47  Pulse: 60 62 87 63  Resp: Temp: 98.3 F (36.8 C) 99.1 F (37.3 C) 98.9 F (37.2 C) 98.9 F (37.2 C)  TempSrc: Oral Oral Oral Oral  SpO2:  100%    Weight:      Height:       General: alert, cooperative and no distress Lochia: appropriate Uterine Fundus: firm Incision: Dressing is clean, dry, and intact DVT Evaluation: No significant calf/ankle  edema. Labs: Lab Results  Component Value Date   WBC 11.1 (H) 11/13/2016   HGB 10.3 (L) 11/13/2016   HCT 29.1 (L) 11/13/2016   MCV 93.9 11/13/2016   PLT 268 11/13/2016   CMP Latest Ref Rng & Units 05/08/2016  Glucose 65 - 99 mg/dL -  BUN 6 - 20 mg/dL -  Creatinine 1.30 - 8.65 mg/dL -  Sodium 784 - 696 mmol/L -  Potassium 3.5 - 5.1 mmol/L -  Chloride 101 - 111 mmol/L -  CO2 22 - 32 mmol/L -  Calcium 8.9 - 10.3 mg/dL -  Total Protein 6.0 - 8.5 g/dL 7.3  Total Bilirubin 0.0 - 1.2 mg/dL 0.3  Alkaline Phos 39 - 117 IU/L 52  AST 0 - 40 IU/L 15  ALT 0 - 32 IU/L 28    Discharge instruction: per After Visit Summary and "Baby and Me Booklet".  After visit meds:  Allergies as of 11/14/2016  Reactions   Bee Venom Anaphylaxis   Pineapple Shortness Of Breath, Itching   Codeine Nausea And Vomiting      Medication List    TAKE these medications   ibuprofen 600 MG tablet Commonly known as:  ADVIL,MOTRIN Take 1 tablet (600 mg total) by mouth every 6 (six) hours.   prenatal multivitamin Tabs tablet Take 1 tablet by mouth daily at 12 noon.   senna-docusate 8.6-50 MG tablet Commonly known as:  Senokot-S Take 2 tablets by mouth at bedtime as needed for mild constipation.       Diet: routine diet  Activity: Advance as tolerated. Pelvic rest for 6 weeks.   Outpatient follow up:4 weeks Follow up Appt: Future Appointments Date Time Provider Department Center  12/11/2016 10:30 AM Constant, Peggy, MD [facility] None   Postpartum contraception: Tubal Ligation  Newborn Data: Live born female  Birth Weight: 6 lb 5.9 oz (2890 g) APGAR: 8, 9  Baby Feeding: Breast Disposition:home with mother   11/14/2016 Josephine Igo, Medical Student  OB FELLOW DISCHARGE ATTESTATION  I have seen and examined this patient. I agree with above documentation and have made edits as needed.   Caryl Ada, DO OB Fellow

## 2016-11-14 NOTE — Lactation Note (Signed)
This note was copied from a baby's chart. Lactation Consultation Note  Patient Name: Lindsey [name] [id number] Date: 11/14/2016 Reason for consult: Follow-up assessment Baby at 24 hr of life. Mom called for latch help. Upon entry baby had a shallow latch on the R breast in football. When baby came off the breast, mom had a noticeable horizontal compression stripe. RN to give coconut oil. Had mom uncover baby's hands and re position baby to more sitting up/facing the breast. Baby was able to latch more deeply because his chin was not resting on his chest. Dad stated baby has been tongue sucking and pushing the nipple out of his mouth. Mom is worried that baby has a small mouth. Baby has a nice gape and flanges lips well. At this visit baby has a pointed tongue tip, extend tongue over gum ridge, did not lift tongue past mid line, but was able to maintain rhythmic bursts of sucking. Encouraged parents to keep working on a deep latch and reviewed suck training.     Maternal Data    Feeding Feeding Type: Breast Fed Length of feed:  (latched on exit)  LATCH Score Latch: Repeated attempts needed to sustain latch, nipple held in mouth throughout feeding, stimulation needed to elicit sucking reflex.  Audible Swallowing: A few with stimulation  Type of Nipple: Everted at rest and after stimulation  Comfort (Breast/Nipple): Filling, red/small blisters or bruises, mild/mod discomfort  Hold (Positioning): Assistance needed to correctly position infant at breast and maintain latch.  LATCH Score: 6  Interventions Interventions: Assisted with latch;Skin to skin;Breast massage;Hand express;Position options;Support pillows  Lactation Tools Discussed/Used     Consult Status Consult Status: Follow-up Date: 11/15/16 Follow-up type: In-patient    Rulon Eisenmenger 11/14/2016, 12:08 PM

## 2016-11-14 NOTE — Discharge Instructions (Signed)

## 2016-11-17 ENCOUNTER — Other Ambulatory Visit: Payer: Medicaid Other

## 2016-11-17 NOTE — Addendum Note (Signed)
Addendum  created 11/17/16 1325 by Jairo Ben, MD   Anesthesia Event edited, Anesthesia Staff edited

## 2016-11-17 NOTE — Anesthesia Postprocedure Evaluation (Signed)
Anesthesia Post Note  Patient: Lindsey [name]  Procedure(s) Performed: AN AD HOC LABOR EPIDURAL     Patient location during evaluation: Mother Baby Anesthesia Type: Epidural Level of consciousness: awake and alert Pain management: pain level controlled Vital Signs Assessment: post-procedure vital signs reviewed and stable Respiratory status: spontaneous breathing, nonlabored ventilation and respiratory function stable Cardiovascular status: stable Postop Assessment: no headache, no backache and epidural receding Anesthetic complications: no    Last Vitals: There were no vitals filed for this visit.  Last Pain: There were no vitals filed for this visit.               Phillips Grout

## 2016-11-21 NOTE — Addendum Note (Signed)
Addendum  created 11/21/16 1610 by Phillips Grout, MD   Anesthesia Staff edited

## 2016-11-24 ENCOUNTER — Other Ambulatory Visit: Payer: Medicaid Other

## 2016-11-30 ENCOUNTER — Telehealth: Payer: Self-pay

## 2016-11-30 ENCOUNTER — Inpatient Hospital Stay (HOSPITAL_COMMUNITY)
Admission: AD | Admit: 2016-11-30 | Discharge: 2016-11-30 | Disposition: A | Discharge status: Home / Self Care | Payer: Medicaid Other | Source: Ambulatory Visit | Attending: Obstetrics and Gynecology | Admitting: Obstetrics and Gynecology

## 2016-11-30 DIAGNOSIS — Z87891 Personal history of nicotine dependence: Secondary | ICD-10-CM | POA: Diagnosis not present

## 2016-11-30 DIAGNOSIS — O9220 Unspecified disorder of breast associated with pregnancy and the puerperium: Secondary | ICD-10-CM | POA: Diagnosis not present

## 2016-11-30 DIAGNOSIS — L292 Pruritus vulvae: Secondary | ICD-10-CM | POA: Diagnosis not present

## 2016-11-30 DIAGNOSIS — O9089 Other complications of the puerperium, not elsewhere classified: Secondary | ICD-10-CM | POA: Insufficient documentation

## 2016-11-30 DIAGNOSIS — N6459 Other signs and symptoms in breast: Secondary | ICD-10-CM

## 2016-11-30 DIAGNOSIS — R102 Pelvic and perineal pain: Secondary | ICD-10-CM | POA: Diagnosis present

## 2016-11-30 LAB — WET PREP, GENITAL
CLUE CELLS WET PREP: NONE SEEN
SPERM: NONE SEEN
Trich, Wet Prep: NONE SEEN
Yeast Wet Prep HPF POC: NONE SEEN

## 2016-11-30 MED ORDER — FLUCONAZOLE 150 MG PO TABS: 150.0000 mg | ORAL_TABLET | Freq: Once | ORAL | 0 refills | Status: AC

## 2016-11-30 NOTE — MAU Note (Signed)
Pt. States that she delivered on the 11/13/2016 and a week after she started noticing vaginal swelling, itching, and burning. She states that it has gotton worse over the last week.   She reports intermittent pain 5/10.  Denies vag bleeding/ discharge  Pt. states she has been using benadryl, preparation h, vagisil, dermoplast, a and d ointment, destin with no relief

## 2016-11-30 NOTE — Discharge Instructions (Signed)

## 2016-11-30 NOTE — MAU Provider Note (Signed)
History     CSN: [id number]  Arrival date and time: 11/30/16 1834  Chief Complaint  Patient presents with  . Vaginal Pain   31 y.o. female post SVD 2 weeks ago here with vaginal swelling, pain, and itching. Sx started about one week ago. Sx got better for a few days after she stopped wearing spanx and peri pads but then worsened yesterday. Denies any new soaps, lotions, or detergents. Nothing in the vagina since delivery. No longer bleeding. Not using peri pads now. Took Benadryl and had some relief. Also endorses bilateral nipple itching x1 day. No shooting pain. No pain after feeds.    Past Medical History:  Diagnosis Date  . Anemia   . Asthma   . Medical history non-contributory   . Systemic lupus erythematosus (HCC)     Past Surgical History:  Procedure Laterality Date  . CHOLECYSTECTOMY    . TOOTH EXTRACTION    . TUBAL LIGATION Bilateral 11/13/2016   Procedure: POST PARTUM PARTIAL SALPINGECTOMY;  Surgeon: Tereso NewcomerAnyanwu, Ugonna A, MD;  Location: [facility];  Service: Gynecology;  Laterality: Bilateral;    Family History  Problem Relation Age of Onset  . Diabetes Mother   . Diabetes Paternal Grandmother     Social History  Substance Use Topics  . Smoking status: Former Smoker    Quit date: 04/18/2011  . Smokeless tobacco: Never Used  . Alcohol use No    Allergies:  Allergies  Allergen Reactions  . Bee Venom Anaphylaxis  . Pineapple Shortness Of Breath and Itching  . Codeine Nausea And Vomiting    Prescriptions Prior to Admission  Medication Sig Dispense Refill Last Dose  . acetaminophen (TYLENOL) 500 MG tablet Take 1,000 mg by mouth every 6 (six) hours as needed for moderate pain.   Past Week at Unknown time  . diphenhydrAMINE (BENADRYL) 25 MG tablet Take 25 mg by mouth every 6 (six) hours as needed for allergies.   11/30/2016 at Unknown time  . ibuprofen (ADVIL,MOTRIN) 600 MG tablet Take 1 tablet (600 mg total) by mouth every 6 (six) hours. (Patient not  taking: Reported on 11/30/2016) 30 tablet 0 Not Taking at Unknown time  . Prenatal Vit-Fe Fumarate-FA (PRENATAL MULTIVITAMIN) TABS tablet Take 1 tablet by mouth daily at 12 noon. (Patient not taking: Reported on 11/30/2016) 30 tablet 1 Not Taking at Unknown time  . senna-docusate (SENOKOT-S) 8.6-50 MG tablet Take 2 tablets by mouth at bedtime as needed for mild constipation. (Patient not taking: Reported on 11/30/2016) 30 tablet 1 Not Taking at Unknown time    Review of Systems  Constitutional: Negative.   Genitourinary: Positive for vaginal pain. Negative for difficulty urinating, dysuria and vaginal bleeding.   Physical Exam   Blood pressure 137/83, pulse 83, temperature 98.6 F (37 C), temperature source Oral, resp. rate 20, height 5\' 4"  (1.626 m), weight 191 lb (86.6 kg), SpO2 100 %, unknown if currently breastfeeding.  Physical Exam  Constitutional: She is oriented to person, place, and time. She appears well-developed and well-nourished. No distress.  HENT:  Head: Normocephalic.  Neck: Normal range of motion.  Respiratory: Effort normal. Right breast exhibits skin change (mild erythema). Right breast exhibits no inverted nipple, no mass and no nipple discharge. Left breast exhibits skin change (mild erythema). Left breast exhibits no inverted nipple, no mass and no nipple discharge.  Genitourinary: There is tenderness on the right labia. There is no rash, lesion or injury on the right labia. There is tenderness on the left  labia. There is no rash, lesion or injury on the left labia.  Genitourinary Comments: Edema to vulva, labia, & clitoris No erythema No discharge  Musculoskeletal: Normal range of motion.  Neurological: She is alert and oriented to person, place, and time.  Skin: Skin is warm and dry.  Psychiatric: She has a normal mood and affect.   Results for orders placed or performed during the hospital encounter of 11/30/16 (from the past 24 hour(s))  Wet prep, genital      Status: Abnormal   Collection Time: 11/30/16  7:22 PM  Result Value Ref Range   Yeast Wet Prep HPF POC NONE SEEN NONE SEEN   Trich, Wet Prep NONE SEEN NONE SEEN   Clue Cells Wet Prep HPF POC NONE SEEN NONE SEEN   WBC, Wet Prep HPF POC MANY (A) NONE SEEN   Sperm NONE SEEN     MAU Course  Procedures  MDM Labs ordered and reviewed. Sx likely caused by yeast or allergic reaction from contact with peri pads. Recommend daily tub soaks with baking soda. Cool packs. Rx Diflucan. No pads. Change cotton underwear frequently. Stable for discharge home.  Assessment and Plan   1. Vulvar itching   2. Nipple problem    Discharge home Follow up at [facility] next week if sx not improving Rx Diflucan Sitz baths  Allergies as of 11/30/2016      Reactions   Bee Venom Anaphylaxis   Pineapple Shortness Of Breath, Itching   Codeine Nausea And Vomiting      Medication List    STOP taking these medications   ibuprofen 600 MG tablet Commonly known as:  ADVIL,MOTRIN   senna-docusate 8.6-50 MG tablet Commonly known as:  Senokot-S     TAKE these medications   acetaminophen 500 MG tablet Commonly known as:  TYLENOL Take 1,000 mg by mouth every 6 (six) hours as needed for moderate pain.   diphenhydrAMINE 25 MG tablet Commonly known as:  BENADRYL Take 25 mg by mouth every 6 (six) hours as needed for allergies.   fluconazole 150 MG tablet Commonly known as:  DIFLUCAN Take 1 tablet (150 mg total) by mouth once. May repeat dose on day 4   prenatal multivitamin Tabs tablet Take 1 tablet by mouth daily at 12 noon.      Donette Larry, CNM 11/30/2016, 7:57 PM

## 2016-11-30 NOTE — Telephone Encounter (Signed)
Pt calls and complains of having vaginal swelling, itching, and burning. She has not had IC since delivery. Pt advised to go to MAU for evaluation.

## 2016-12-01 LAB — GC/CHLAMYDIA PROBE AMP (~~LOC~~) NOT AT ARMC
CHLAMYDIA, DNA PROBE: NEGATIVE
Neisseria Gonorrhea: NEGATIVE

## 2016-12-06 ENCOUNTER — Other Ambulatory Visit (HOSPITAL_COMMUNITY)
Admission: RE | Admit: 2016-12-06 | Discharge: 2016-12-06 | Disposition: A | Discharge status: Home / Self Care | Payer: Medicaid Other | Source: Ambulatory Visit | Attending: Obstetrics | Admitting: Obstetrics

## 2016-12-06 ENCOUNTER — Encounter: Payer: Self-pay | Admitting: Obstetrics

## 2016-12-06 ENCOUNTER — Ambulatory Visit (INDEPENDENT_AMBULATORY_CARE_PROVIDER_SITE_OTHER): Payer: Medicaid Other | Admitting: Obstetrics

## 2016-12-06 DIAGNOSIS — Z9851 Tubal ligation status: Secondary | ICD-10-CM | POA: Insufficient documentation

## 2016-12-06 DIAGNOSIS — N760 Acute vaginitis: Secondary | ICD-10-CM | POA: Diagnosis present

## 2016-12-06 DIAGNOSIS — L239 Allergic contact dermatitis, unspecified cause: Secondary | ICD-10-CM | POA: Diagnosis not present

## 2016-12-06 DIAGNOSIS — Z1389 Encounter for screening for other disorder: Secondary | ICD-10-CM

## 2016-12-06 MED ORDER — PREDNISONE 10 MG (21) PO TBPK: ORAL_TABLET | ORAL | 0 refills | Status: DC

## 2016-12-06 NOTE — Progress Notes (Signed)
Post Partum Exam  Albertina ParrShauntrelle Warchol is a 31 y.o. [phone] female who presents for a postpartum visit. She is 3 weeks postpartum following a spontaneous vaginal delivery. I have fully reviewed the prenatal and intrapartum course. The delivery was at 38 gestational weeks.  Anesthesia: epidural. Postpartum course has been ok. Baby's course has been good. Baby is feeding by both breast and bottle - Enfamil Gentlease and breast milk. Bleeding no bleeding. Bowel function is slightly constipated. Bladder function is normal. Patient is not sexually active. Contraception method is tubal ligation. Postpartum depression screening: SCORE OF 9  The following portions of the patient's history were reviewed and updated as appropriate: allergies, current medications, past family history, past medical history, past social history, past surgical history and problem list.  Review of Systems A comprehensive review of systems was negative.    Objective:  unknown if currently breastfeeding.  General:  alert and no distress   Breasts:  inspection negative, no nipple discharge or bleeding, no masses or nodularity palpable  Lungs: clear to auscultation bilaterally  Heart:  regular rate and rhythm, S1, S2 normal, no murmur, click, rub or gallop  Abdomen: soft, non-tender; bowel sounds normal; no masses,  no organomegaly   Vulva:  positive for swelling bilaterally  Vagina: normal vagina, no discharge, exudate, lesion, or erythema  Cervix:  no lesions  Corpus: normal size, contour, position, consistency, mobility, non-tender  Adnexa:  no mass, fullness, tenderness  Rectal Exam: Not performed.        Assessment:   1. Postpartum care following vaginal delivery  2. Acute vaginitis Rx: - Cervicovaginal ancillary only  3. Allergic contact dermatitis, unspecified trigger - probable allergic reaction to sanitary pads Rx: - predniSONE (STERAPRED UNI-PAK 21 TAB) 10 MG (21) TBPK tablet; Take as directed.  Dispense: 21  tablet; Refill: 0  Plan:   1. Contraception: abstinence 2. Prednisone Rx 3. Follow up in: 3 weeks or as needed.   Iqra Rotundo A. Clearance CootsHarper MD

## 2016-12-08 LAB — CERVICOVAGINAL ANCILLARY ONLY
BACTERIAL VAGINITIS: POSITIVE — AB
Candida vaginitis: NEGATIVE
TRICH (WINDOWPATH): NEGATIVE

## 2016-12-09 ENCOUNTER — Other Ambulatory Visit: Payer: Self-pay | Admitting: Obstetrics

## 2016-12-09 DIAGNOSIS — B9689 Other specified bacterial agents as the cause of diseases classified elsewhere: Secondary | ICD-10-CM

## 2016-12-09 DIAGNOSIS — N760 Acute vaginitis: Principal | ICD-10-CM

## 2016-12-09 MED ORDER — SECNIDAZOLE 2 G PO PACK: 1.0000 | PACK | Freq: Once | ORAL | 2 refills | Status: AC

## 2016-12-11 ENCOUNTER — Telehealth: Payer: Self-pay | Admitting: Pediatrics

## 2016-12-11 ENCOUNTER — Ambulatory Visit: Payer: Medicaid Other | Admitting: Obstetrics and Gynecology

## 2016-12-11 NOTE — Telephone Encounter (Signed)
PA for Solosec?  Pt has not had 2 failed drugs. Would you like to change the rx?

## 2016-12-12 NOTE — Telephone Encounter (Signed)
Pt advised and voiced understanding.   

## 2016-12-12 NOTE — Telephone Encounter (Signed)
Send PA as failing Metronidazole and Clindesse.  We tried samples of Clindesse.

## 2016-12-12 NOTE — Telephone Encounter (Signed)
[facility] PA approved # [id number].   I spoke with pharmacy-Rx being filled now.  LM for pt TCB

## 2016-12-27 ENCOUNTER — Encounter: Payer: Self-pay | Admitting: Obstetrics and Gynecology

## 2016-12-27 ENCOUNTER — Ambulatory Visit (INDEPENDENT_AMBULATORY_CARE_PROVIDER_SITE_OTHER): Payer: Medicaid Other | Admitting: Obstetrics and Gynecology

## 2016-12-27 MED ORDER — FLUCONAZOLE 200 MG PO TABS: 200.0000 mg | ORAL_TABLET | Freq: Every day | ORAL | 1 refills | Status: AC

## 2016-12-27 NOTE — Progress Notes (Addendum)
Subjective:     Lindsey ParrShauntrelle Burges is a 31 y.o. female who presents for a postpartum visit. She is 6 weeks postpartum following a spontaneous vaginal delivery. I have fully reviewed the prenatal and intrapartum course. The delivery was at 38 gestational weeks. Outcome: spontaneous vaginal delivery. Anesthesia: epidural. Postpartum course has been uncomplicated. Baby's course has been uncomplicated. Baby is feeding by breast. Bleeding no bleeding. Bowel function is normal. Bladder function is normal. Patient is sexually active. Contraception method is tubal ligation. Postpartum depression screening: negative. Patient reports being diagnosed with yeast mastitis and infant has thrush. Her newborn is starting treatment today     Review of Systems Pertinent items are noted in HPI.   Objective:    BP 128/85 (BP Location: Left Arm, Patient Position: Sitting, Cuff Size: Large)   Pulse 88   Wt 198 lb (89.8 kg)   Breastfeeding? Yes   BMI 33.99 kg/m   General:  alert, cooperative and no distress   Breasts:  inspection negative, no nipple discharge or bleeding, no masses or nodularity palpable  Lungs: clear to auscultation bilaterally  Heart:  regular rate and rhythm  Abdomen: soft, non-tender; bowel sounds normal; no masses,  no organomegaly  Incision: no erythema, induration or drainage. Small area of granulation tissue measuring 0.5 cm- silver nitrate aplied   Vulva:  normal  Vagina: normal vagina, no discharge, exudate, lesion, or erythema  Cervix:  multiparous appearance  Corpus: normal size, contour, position, consistency, mobility, non-tender  Adnexa:  normal adnexa and no mass, fullness, tenderness  Rectal Exam: Not performed.        Assessment:     Normal postpartum exam. Pap smear not done at today's visit.  Patient with LGSIL on pap smear Plan:    1. Contraception: tubal ligation 2. Patient is medically cleared to resume all activities of daily living. Patient late for  appointment and therefore colpo not done today. Patient will return for colposcopy Rx diflucan provided for yeast mastitis 3. Follow up in: 1-2 weeks for colpos or as needed.

## 2016-12-27 NOTE — Addendum Note (Signed)
Addended by: Catalina AntiguaONSTANT, Zaniya Mcaulay on: 12/27/2016 04:49 PM   Modules accepted: Orders

## 2017-03-09 ENCOUNTER — Other Ambulatory Visit: Payer: Self-pay

## 2017-03-09 ENCOUNTER — Inpatient Hospital Stay (HOSPITAL_COMMUNITY)
Admission: AD | Admit: 2017-03-09 | Discharge: 2017-03-09 | Disposition: A | Discharge status: Home / Self Care | Payer: Medicaid Other | Source: Ambulatory Visit | Attending: Obstetrics and Gynecology | Admitting: Obstetrics and Gynecology

## 2017-03-09 ENCOUNTER — Encounter (HOSPITAL_COMMUNITY): Payer: Self-pay | Admitting: *Deleted

## 2017-03-09 DIAGNOSIS — N939 Abnormal uterine and vaginal bleeding, unspecified: Secondary | ICD-10-CM

## 2017-03-09 DIAGNOSIS — R109 Unspecified abdominal pain: Secondary | ICD-10-CM | POA: Insufficient documentation

## 2017-03-09 DIAGNOSIS — A084 Viral intestinal infection, unspecified: Secondary | ICD-10-CM

## 2017-03-09 DIAGNOSIS — N946 Dysmenorrhea, unspecified: Secondary | ICD-10-CM | POA: Diagnosis not present

## 2017-03-09 DIAGNOSIS — Z87891 Personal history of nicotine dependence: Secondary | ICD-10-CM | POA: Insufficient documentation

## 2017-03-09 DIAGNOSIS — Z885 Allergy status to narcotic agent status: Secondary | ICD-10-CM | POA: Diagnosis not present

## 2017-03-09 DIAGNOSIS — Z634 Disappearance and death of family member: Secondary | ICD-10-CM

## 2017-03-09 LAB — URINALYSIS, ROUTINE W REFLEX MICROSCOPIC
Bacteria, UA: NONE SEEN
Bilirubin Urine: NEGATIVE
GLUCOSE, UA: NEGATIVE mg/dL
Ketones, ur: 20 mg/dL — AB
Leukocytes, UA: NEGATIVE
NITRITE: NEGATIVE
PH: 5 (ref 5.0–8.0)
PROTEIN: 30 mg/dL — AB
Specific Gravity, Urine: 1.04 — ABNORMAL HIGH (ref 1.005–1.030)

## 2017-03-09 LAB — POCT PREGNANCY, URINE: Preg Test, Ur: NEGATIVE

## 2017-03-09 MED ORDER — IBUPROFEN 600 MG PO TABS: 600.0000 mg | ORAL_TABLET | Freq: Four times a day (QID) | ORAL | 1 refills | Status: AC | PRN

## 2017-03-09 MED ORDER — ONDANSETRON HCL 4 MG PO TABS: 4.0000 mg | ORAL_TABLET | Freq: Three times a day (TID) | ORAL | 0 refills | Status: AC | PRN

## 2017-03-09 NOTE — MAU Provider Note (Signed)
Chief Complaint: Abdominal Pain   None     SUBJECTIVE HPI: Lindsey [name] is a 32 y.o. [phone] with recent hx significant for SVD in October with BTL then infant death of SIDS on 02/08/18. She presents to maternity admissions reporting nausea x 2 weeks and occasional diarrhea x 2 weeks with onset of LLQ abdominal pain today while at work.  Her pain was associated with menses 17 days late but she started bleeding before coming to MAU. Pain is sharp, intermittent, and worsening since onset.  She is concerned about ectopic pregnancy since she had a BTL.  She has not taken a pregnancy test. There are no other associated symptoms. She has not tried any treatments. She denies vaginal itching/burning, urinary symptoms, h/a, dizziness, or fever/chills.     HPI  Past Medical History:  Diagnosis Date  . Anemia   . Asthma   . Medical history non-contributory   . Systemic lupus erythematosus (HCC)    Past Surgical History:  Procedure Laterality Date  . CHOLECYSTECTOMY    . TOOTH EXTRACTION    . TUBAL LIGATION Bilateral 11/13/2016   Procedure: POST PARTUM PARTIAL SALPINGECTOMY;  Surgeon: Tereso NewcomerAnyanwu, Ugonna A, MD;  Location: [facility];  Service: Gynecology;  Laterality: Bilateral;   Social History   Socioeconomic History  . Marital status: Single    Spouse name: Not on file  . Number of children: Not on file  . Years of education: Not on file  . Highest education level: Not on file  Social Needs  . Financial resource strain: Not on file  . Food insecurity - worry: Not on file  . Food insecurity - inability: Not on file  . Transportation needs - medical: Not on file  . Transportation needs - non-medical: Not on file  Occupational History  . Not on file  Tobacco Use  . Smoking status: Former Smoker    Last attempt to quit: 04/18/2011    Years since quitting: 5.8  . Smokeless tobacco: Never Used  Substance and Sexual Activity  . Alcohol use: No  . Drug use: No  . Sexual  activity: Yes    Birth control/protection: None  Other Topics Concern  . Not on file  Social History Narrative  . Not on file   No current facility-administered medications on file prior to encounter.    Current Outpatient Medications on File Prior to Encounter  Medication Sig Dispense Refill  . acetaminophen (TYLENOL) 500 MG tablet Take 1,000 mg by mouth every 6 (six) hours as needed for moderate pain.    . diphenhydrAMINE (BENADRYL) 25 MG tablet Take 25 mg by mouth every 6 (six) hours as needed for allergies.    . fluconazole (DIFLUCAN) 200 MG tablet Take 1 tablet (200 mg total) daily by mouth. Take two tablets on first day, then one tablet daily to complete 14 day regimen 15 tablet 1  . Prenatal Vit-Fe Fumarate-FA (PRENATAL MULTIVITAMIN) TABS tablet Take 1 tablet by mouth daily at 12 noon. (Patient not taking: Reported on 12/27/2016) 30 tablet 1   Allergies  Allergen Reactions  . Bee Venom Anaphylaxis  . Pineapple Shortness Of Breath and Itching  . Codeine Nausea And Vomiting    ROS:  Review of Systems  Constitutional: Negative for chills, fatigue and fever.  Respiratory: Negative for shortness of breath.   Cardiovascular: Negative for chest pain.  Gastrointestinal: Positive for abdominal pain, diarrhea, nausea and vomiting.  Genitourinary: Positive for pelvic pain. Negative for difficulty urinating, dysuria, flank pain,  vaginal bleeding, vaginal discharge and vaginal pain.  Neurological: Negative for dizziness and headaches.  Psychiatric/Behavioral: Negative.      I have reviewed patient's Past Medical Hx, Surgical Hx, Family Hx, Social Hx, medications and allergies.   Physical Exam   Patient Vitals for the past 24 hrs:  BP Temp Temp src Pulse Resp SpO2 Height Weight  03/09/17 2036 133/80 - - 72 - - - -  03/09/17 1754 135/78 98.6 F (37 C) Oral 79 18 100 % 5\' 4"  (1.626 m) 205 lb 4 oz (93.1 kg)   Constitutional: Well-developed, well-nourished female in no acute  distress.  Cardiovascular: normal rate Respiratory: normal effort GI: Abd soft, non-tender. Pos BS x 4 MS: Extremities nontender, no edema, normal ROM Neurologic: Alert and oriented x 4.  GU: Neg CVAT.  PELVIC EXAM: Deferred  LAB RESULTS Results for orders placed or performed during the hospital encounter of 03/09/17 (from the past 24 hour(s))  Urinalysis, Routine w reflex microscopic     Status: Abnormal   Collection Time: 03/09/17  5:56 PM  Result Value Ref Range   Color, Urine AMBER (A) YELLOW   APPearance HAZY (A) CLEAR   Specific Gravity, Urine 1.040 (H) 1.005 - 1.030   pH 5.0 5.0 - 8.0   Glucose, UA NEGATIVE NEGATIVE mg/dL   Hgb urine dipstick LARGE (A) NEGATIVE   Bilirubin Urine NEGATIVE NEGATIVE   Ketones, ur 20 (A) NEGATIVE mg/dL   Protein, ur 30 (A) NEGATIVE mg/dL   Nitrite NEGATIVE NEGATIVE   Leukocytes, UA NEGATIVE NEGATIVE   RBC / HPF TOO NUMEROUS TO COUNT 0 - 5 RBC/hpf   WBC, UA 6-30 0 - 5 WBC/hpf   Bacteria, UA NONE SEEN NONE SEEN   Squamous Epithelial / LPF 0-5 (A) NONE SEEN   Mucus PRESENT   Pregnancy, urine POC     Status: None   Collection Time: 03/09/17  6:05 PM  Result Value Ref Range   Preg Test, Ur NEGATIVE NEGATIVE    --/--/O POS, O POS (10/01 0200)  IMAGING No results found.  MAU Management/MDM: Ordered labs and reviewed results.  With irregular menstrual cycle, most likely anovulatory cycle with dysmenorrhea on return of menses today. Pt with significant stress from recent loss of infant.  Cannot rule out ovarian cyst but pt is well appearing, nausea but no vomiting and no sudden increase in nausea with pain onset today so low suspicion for ovarian torsion.  Pt triaged in MAU, negative pregnancy test presented.  Conservative management now with Rx for ibuprofen and Zofran and pt to f/u with [facility], return to MAU for emergencies. Rest/ice/heat/warm bath/Tylenol also for pain.  Pt discharged with strict pain precautions.  ASSESSMENT 1.  Gastroenteritis and colitis, viral   2. Abnormal uterine bleeding (AUB)   3. Dysmenorrhea     PLAN Discharge home Allergies as of 03/09/2017      Reactions   Bee Venom Anaphylaxis   Pineapple Shortness Of Breath, Itching   Codeine Nausea And Vomiting      Medication List    TAKE these medications   acetaminophen 500 MG tablet Commonly known as:  TYLENOL Take 1,000 mg by mouth every 6 (six) hours as needed for moderate pain.   diphenhydrAMINE 25 MG tablet Commonly known as:  BENADRYL Take 25 mg by mouth every 6 (six) hours as needed for allergies.   fluconazole 200 MG tablet Commonly known as:  DIFLUCAN Take 1 tablet (200 mg total) daily by mouth. Take two tablets  on first day, then one tablet daily to complete 14 day regimen   ibuprofen 600 MG tablet Commonly known as:  ADVIL,MOTRIN Take 1 tablet (600 mg total) by mouth every 6 (six) hours as needed.   ondansetron 4 MG tablet Commonly known as:  ZOFRAN Take 1 tablet (4 mg total) by mouth every 8 (eight) hours as needed for nausea or vomiting.   prenatal multivitamin Tabs tablet Take 1 tablet by mouth daily at 12 noon.      Follow-up Information    [facility] Follow up.   Why:  As needed for gyn care, return to MAU as needed for emergencies Contact information: [address] [place] [address] [phone]          Sharen Counter Certified Nurse-Midwife 03/09/2017  9:11 PM

## 2017-03-09 NOTE — MAU Note (Addendum)
Had tubes tied 11/13/16,after delivery. period is now 17 days late.  Is having a lot of sharp pain and pressure in LLQ, started this morning.  Neg HPT x2.  Periods have been irregular since tubal. Pressure is worse than pain. Some nausea.  (lost this baby to SIDS 12/27, lots of stress)

## 2017-03-09 NOTE — MAU Note (Addendum)
Pt in Triage and provider will see her to discuss plan of care. Sharen CounterLisa Leftwich Kirby CNM talked with pt regarding test results. Pt d/c home from Triage.

## 2017-08-09 ENCOUNTER — Ambulatory Visit (HOSPITAL_COMMUNITY): Payer: Medicaid Other | Admitting: Psychiatry

## 2017-11-22 ENCOUNTER — Ambulatory Visit (HOSPITAL_COMMUNITY): Payer: Medicaid Other | Admitting: Psychiatry
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
## 2018-03-07 ENCOUNTER — Other Ambulatory Visit (HOSPITAL_COMMUNITY)
Admission: RE | Admit: 2018-03-07 | Discharge: 2018-03-07 | Disposition: A | Discharge status: Home / Self Care | Payer: Commercial Managed Care - PPO | Source: Ambulatory Visit | Attending: Obstetrics and Gynecology | Admitting: Obstetrics and Gynecology

## 2018-03-07 ENCOUNTER — Other Ambulatory Visit: Payer: Self-pay | Admitting: Obstetrics and Gynecology

## 2018-03-07 DIAGNOSIS — Z01419 Encounter for gynecological examination (general) (routine) without abnormal findings: Secondary | ICD-10-CM | POA: Diagnosis not present

## 2018-03-11 LAB — CYTOLOGY - PAP
Diagnosis: UNDETERMINED — AB
HPV (WINDOPATH): DETECTED — AB

## 2018-04-16 ENCOUNTER — Other Ambulatory Visit: Payer: Self-pay | Admitting: Obstetrics and Gynecology

## 2019-04-11 IMAGING — US US MFM OB DETAIL+14 WK
1 series · 14 of 28 positions shown · non-contrast
Comparison: none

[Series 1: us mfm ob detail+14 wk · 77 acquisitions, 14 frames shown]
[im 3/77]
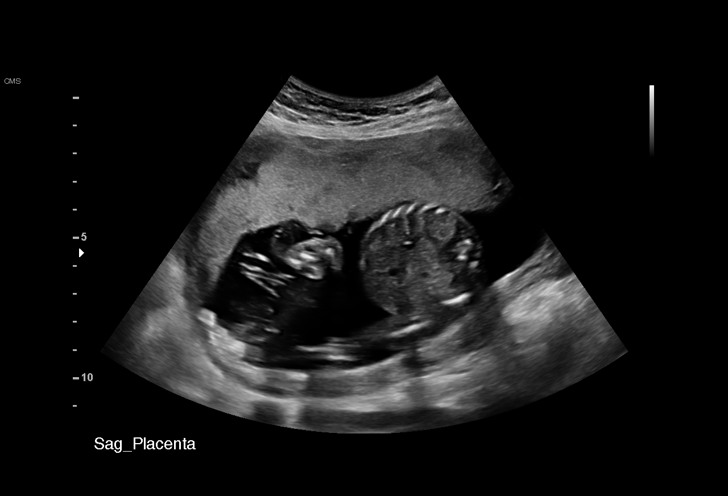
[im 9/77]
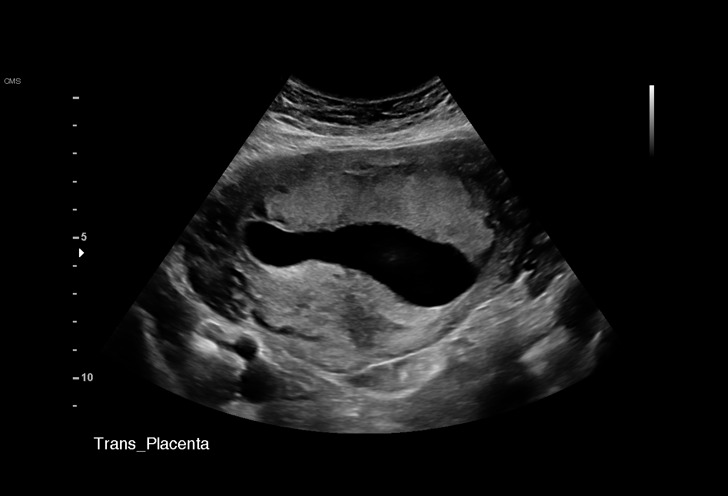
[im 15/77]
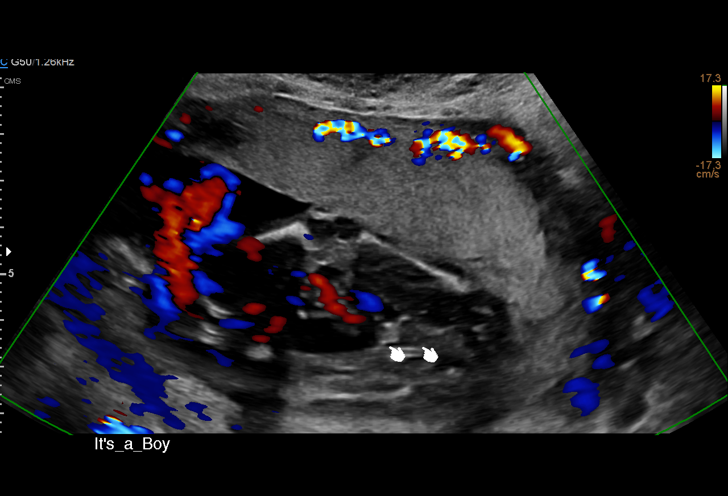
[im 20/77]
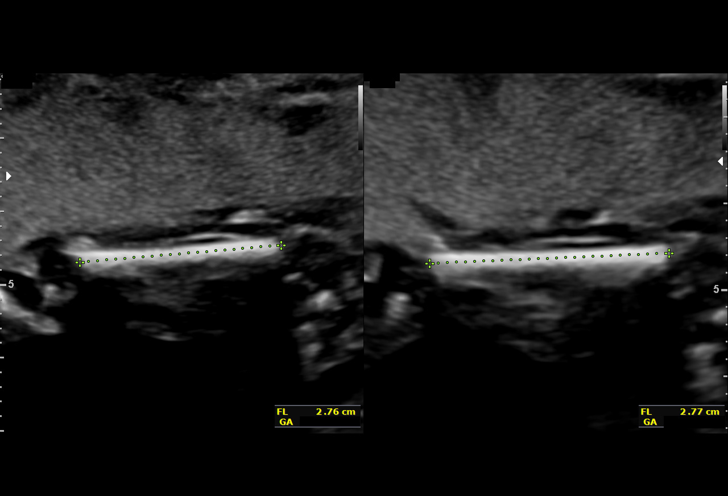
[im 26/77]
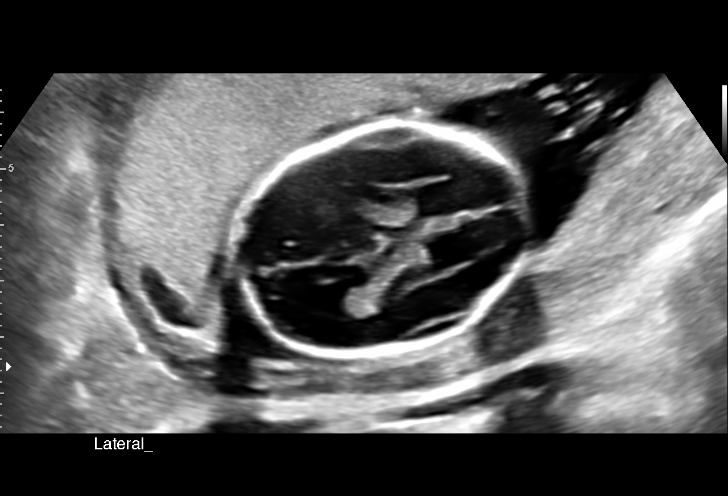
[im 31/77]
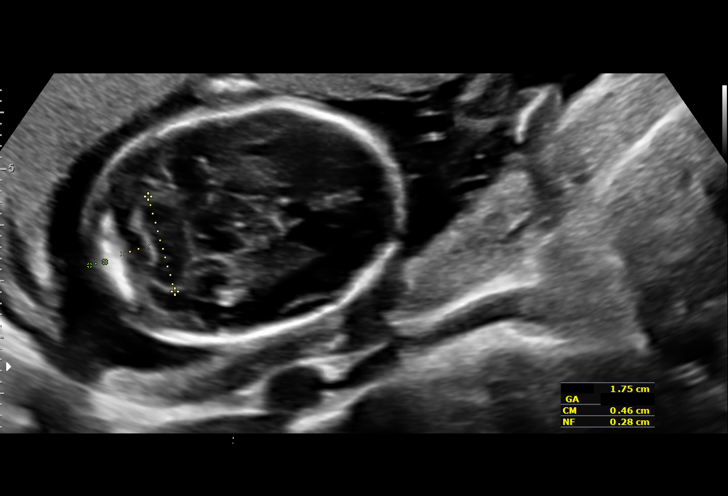
[im 37/77]
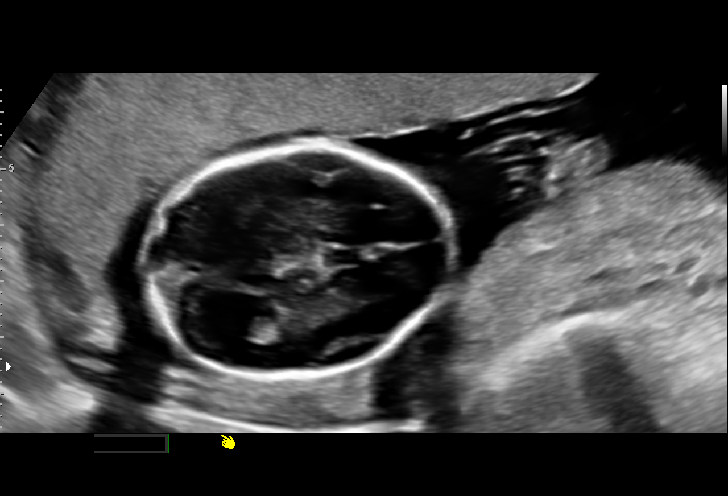
[im 43/77]
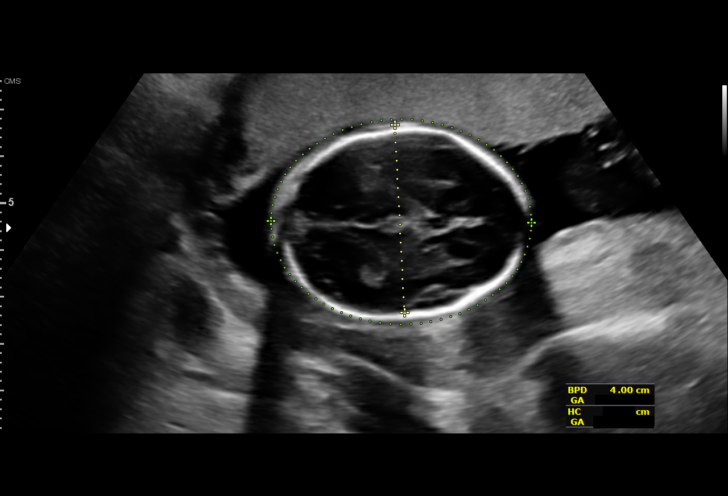
[im 48/77]
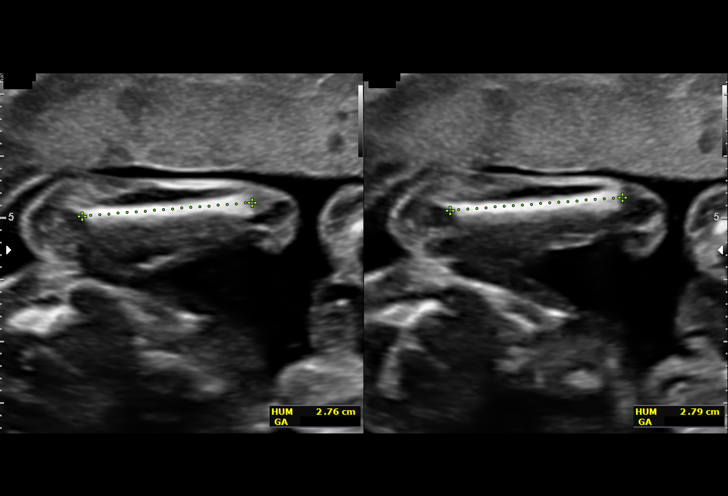
[im 54/77]
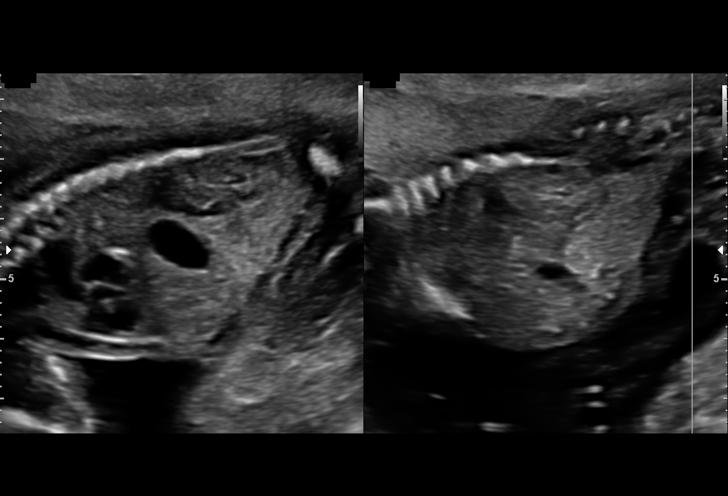
[im 60/77]
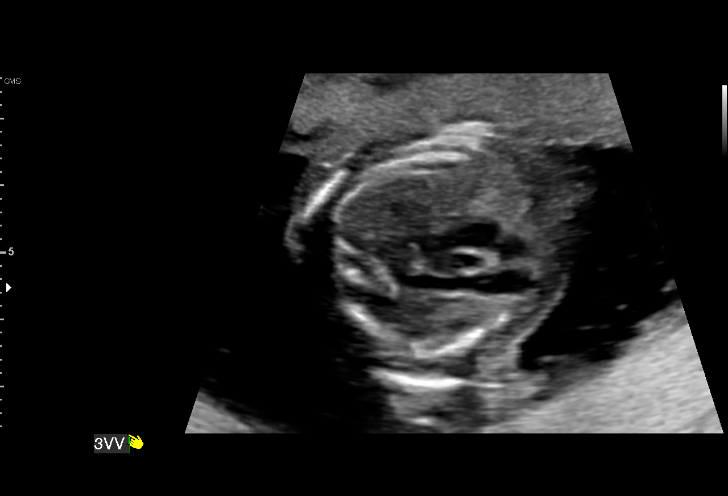
[im 65/77]
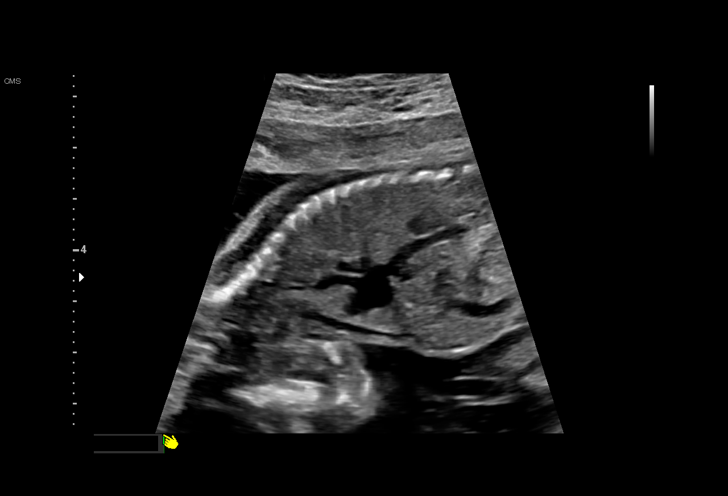
[im 71/77]
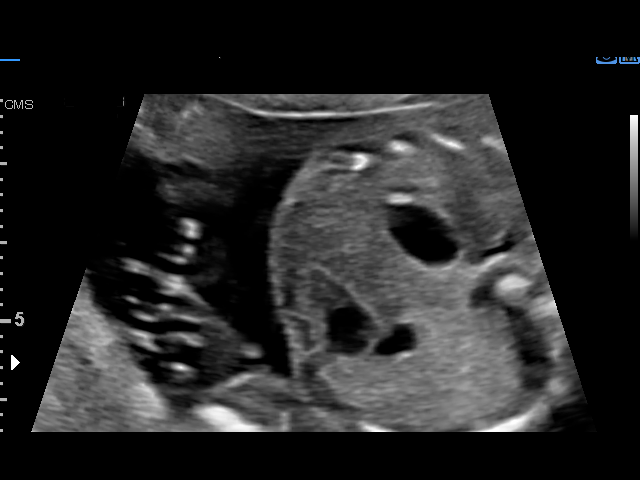
[im 77/77]
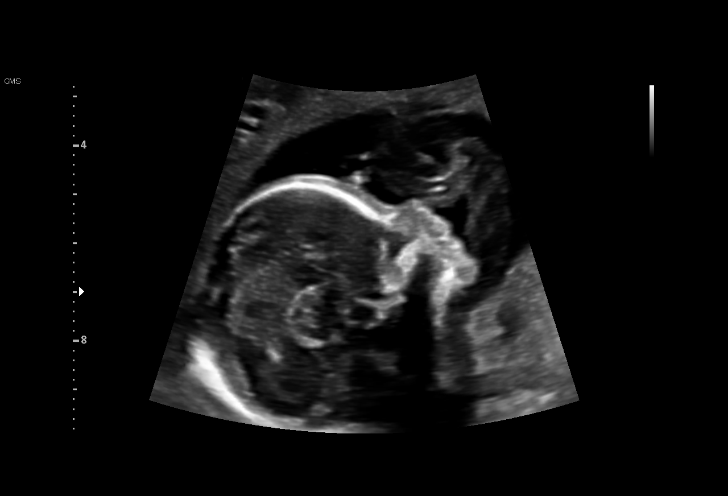

[14 of 28 positions shown; findings below may reference images not displayed]

Road [HOSPITAL]

Indications

18 weeks gestation of pregnancy
Encounter for fetal anatomic survey
Systemic lupus complicating pregnancy,         O26.892,
second trimester
OB History

Gravidity:    4         Term:   1        Prem:   1        SAB:   0
TOP:          1       Ectopic:  0        Living: 2
Fetal Evaluation

Num Of Fetuses:     1
Fetal Heart         146
Rate(bpm):
Cardiac Activity:   Observed
Presentation:       Variable
Placenta:           Anterior, above cervical os
P. Cord Insertion:  Visualized, central

Amniotic Fluid
AFI FV:      Subjectively within normal limits

Largest Pocket(cm)
3.02
Biometry

BPD:      40.2  mm     G. Age:  18w 1d         34  %    CI:        66.17   %    70 - 86
FL/HC:      17.4   %    16.1 -
HC:      158.5  mm     G. Age:  18w 5d         50  %    HC/AC:      1.11        1.09 -
AC:      142.6  mm     G. Age:  19w 4d         79  %    FL/BPD:     68.7   %
FL:       27.6  mm     G. Age:  18w 3d         39  %    FL/AC:      19.4   %    20 - 24
HUM:      27.8  mm     G. Age:  18w 6d         62  %
CER:      17.5  mm     G. Age:  17w 4d         21  %
NFT:       2.8  mm

CM:        4.6  mm

Est. FW:     269  gm      0 lb 9 oz     53  %
Gestational Age

LMP:           18w 4d        Date:  02/21/16                 EDD:   11/27/16
U/S Today:     18w 5d                                        EDD:   11/26/16
Best:          18w 4d     Det. By:  LMP  (02/21/16)          EDD:   11/27/16
Anatomy

Cranium:               Appears normal         Aortic Arch:            Appears normal
Cavum:                 Appears normal         Ductal Arch:            Appears normal
Ventricles:            Appears normal         Diaphragm:              Appears normal
Choroid Plexus:        Appears normal         Stomach:                Appears normal, left
sided
Cerebellum:            Appears normal         Abdomen:                Appears normal
Posterior Fossa:       Appears normal         Abdominal Wall:         Appears nml (cord
insert, abd wall)
Nuchal Fold:           Appears normal         Cord Vessels:           Appears normal (3
vessel cord)
Face:                  Appears normal         Kidneys:                Appear normal
(orbits and profile)
Lips:                  Appears normal         Bladder:                Appears normal
Thoracic:              Appears normal         Spine:                  Appears normal
Heart:                 Appears normal         Upper Extremities:      Appears normal
(4CH, axis, and
situs)
RVOT:                  Not well visualized    Lower Extremities:      Appears normal
LVOT:                  Appears normal

Other:  Male gender. Heels visualized. Nasal bone visualized.
Cervix Uterus Adnexa

Cervix
Length:            4.4  cm.
Normal appearance by transabdominal scan.

Uterus
No abnormality visualized.

Left Ovary
Within normal limits.

Right Ovary
Within normal limits.

Cul De Sac:   No free fluid seen.

Adnexa:       No abnormality visualized.
Impression

Singleton intrauterine pregnancy at 18+3 weeks with SLE,
without antiphospholipid antibodies
Review of the anatomy shows no sonographic markers for
aneuploidy or structural anomalies
However, cardiac evaluations should be considered
suboptimal secondary to early EGA
Amniotic fluid volume is normal
Estimated fetal weight is 269g which is growth in the 53rd
percentile
Recommendations

Repeat scan in 4 weeks, and q4 weeks for growth. See MFM
consult for details
